# Patient Record
Sex: Female | Born: 1985 | Hispanic: No | Marital: Single | Smoking: Current every day smoker
Health system: Southern US, Community
[De-identification: ages and names within clinical notes are randomized; demographics above are authoritative.]

## PROBLEM LIST (undated history)

## (undated) ENCOUNTER — Emergency Department (HOSPITAL_COMMUNITY): Admission: EM | Payer: Medicaid Other

## (undated) DIAGNOSIS — U071 COVID-19: Secondary | ICD-10-CM

## (undated) DIAGNOSIS — R011 Cardiac murmur, unspecified: Secondary | ICD-10-CM

## (undated) HISTORY — DX: Cardiac murmur, unspecified: R01.1

## (undated) HISTORY — PX: TONSILLECTOMY: SHX5217

---
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
## 2006-01-13 ENCOUNTER — Observation Stay: Payer: Self-pay | Admitting: Obstetrics & Gynecology

## 2006-01-14 ENCOUNTER — Observation Stay: Payer: Self-pay

## 2006-01-17 ENCOUNTER — Inpatient Hospital Stay: Payer: Self-pay | Admitting: Obstetrics & Gynecology

## 2006-01-30 ENCOUNTER — Ambulatory Visit: Payer: Self-pay

## 2006-03-01 ENCOUNTER — Ambulatory Visit: Payer: Self-pay

## 2006-12-03 ENCOUNTER — Emergency Department: Payer: Self-pay | Admitting: Emergency Medicine

## 2007-02-13 ENCOUNTER — Emergency Department: Payer: Self-pay | Admitting: Emergency Medicine

## 2007-02-15 ENCOUNTER — Emergency Department: Payer: Self-pay | Admitting: Emergency Medicine

## 2007-05-25 IMAGING — US US OB US >=[ID] SNGL FETUS
1 series · 17 of 28 positions shown · non-contrast
Comparison: none

REASON FOR EXAM: Pain, mild variables
COMMENTS:

[Series 1: us ob us >=(id) sngl fetus · 17 of 37 slices shown]
[im 1/37]
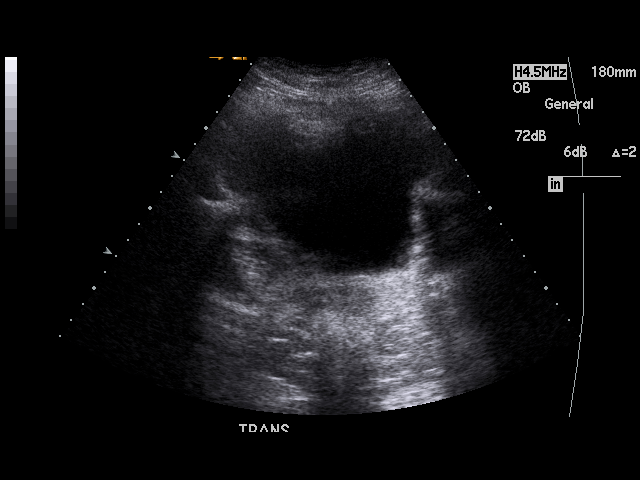
[im 3/37]
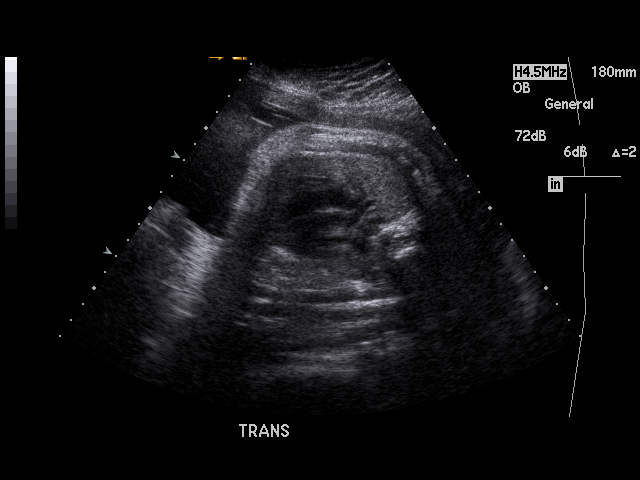
[im 6/37]
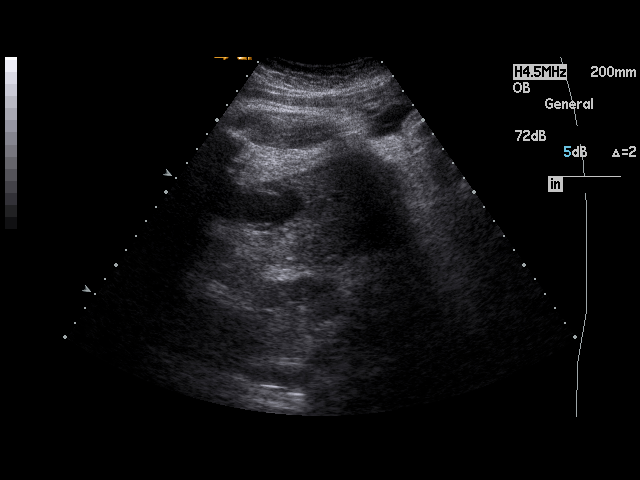
[im 7/37]
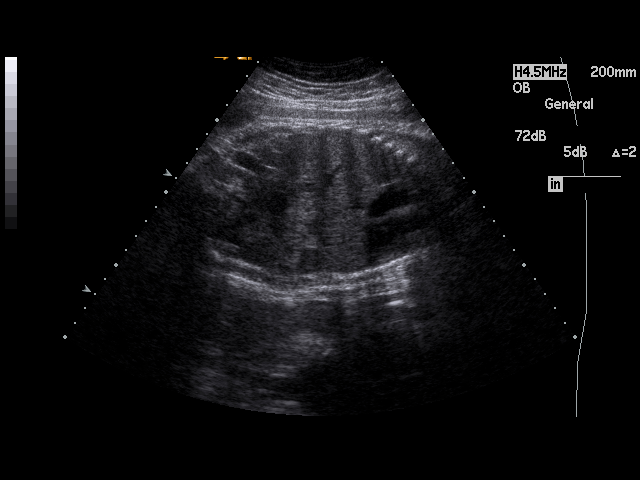
[im 10/37]
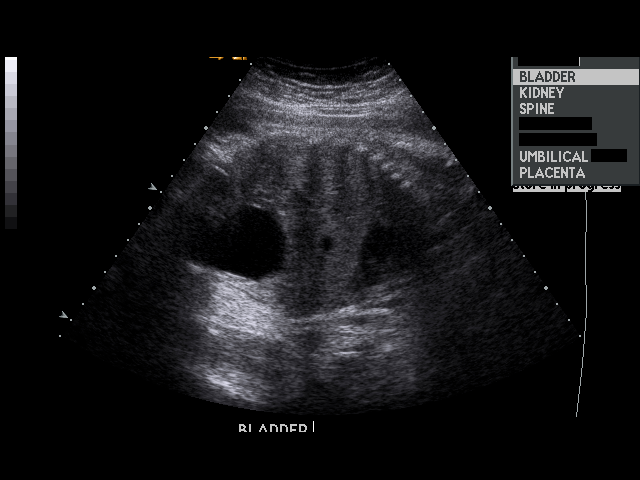
[im 13/37]
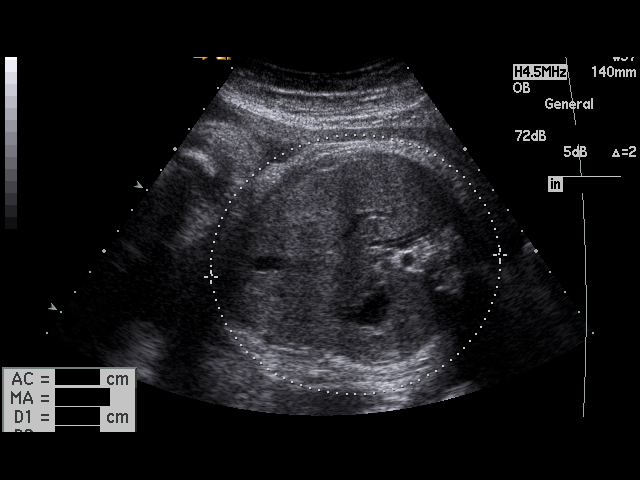
[im 14/37]
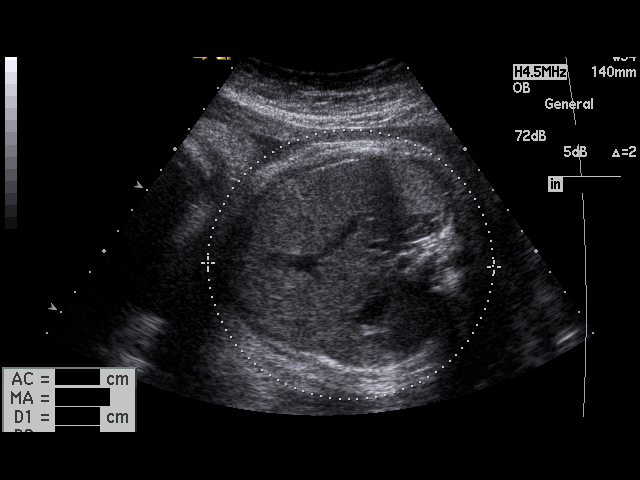
[im 17/37]
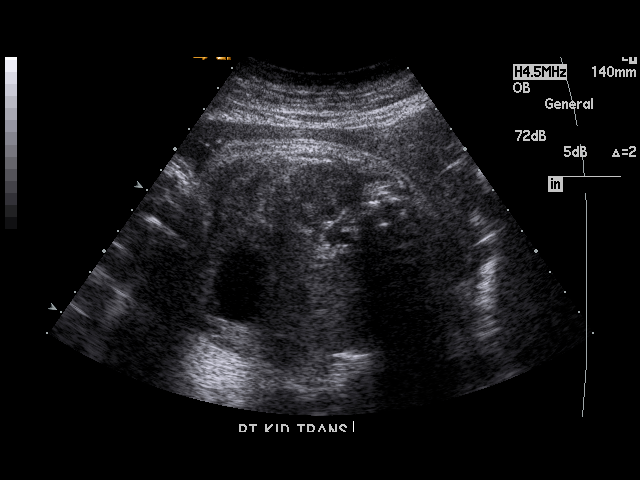
[im 19/37]
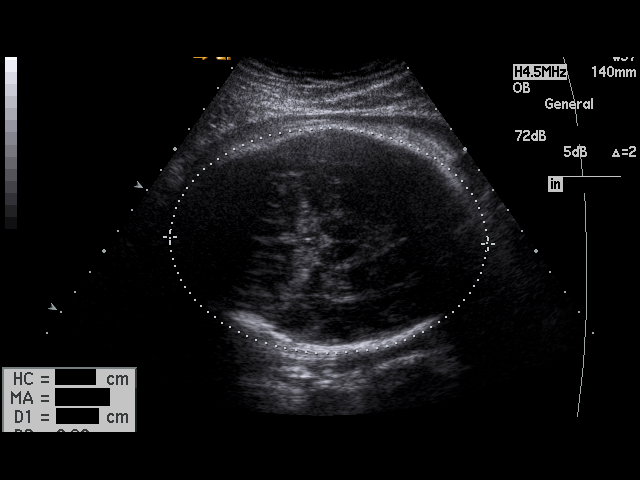
[im 21/37]
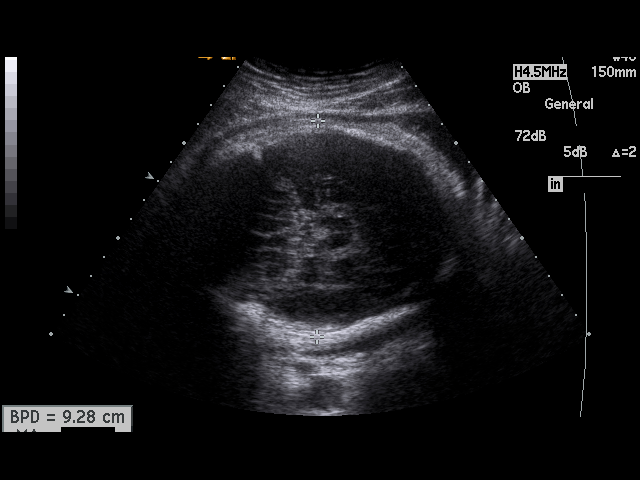
[im 23/37]
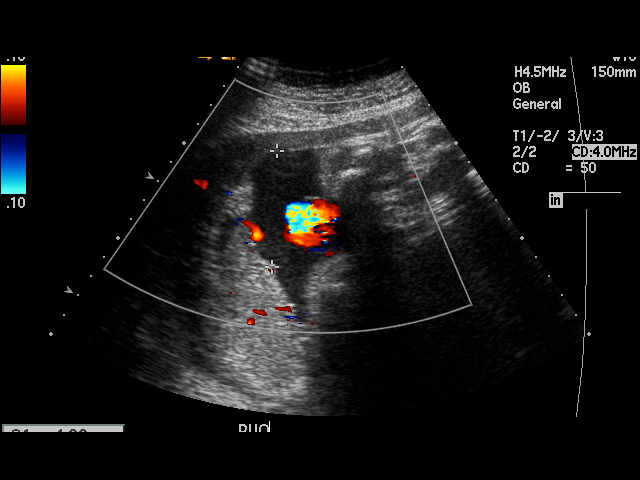
[im 25/37]
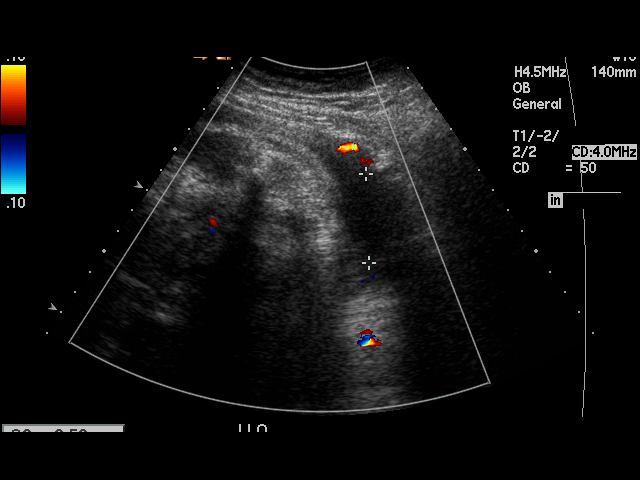
[im 27/37]
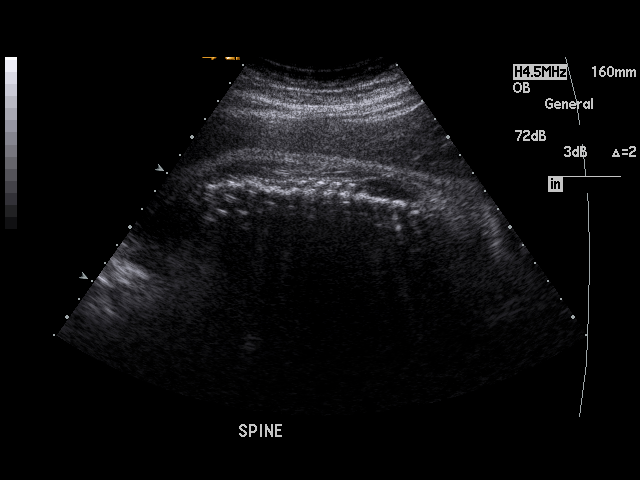
[im 30/37]
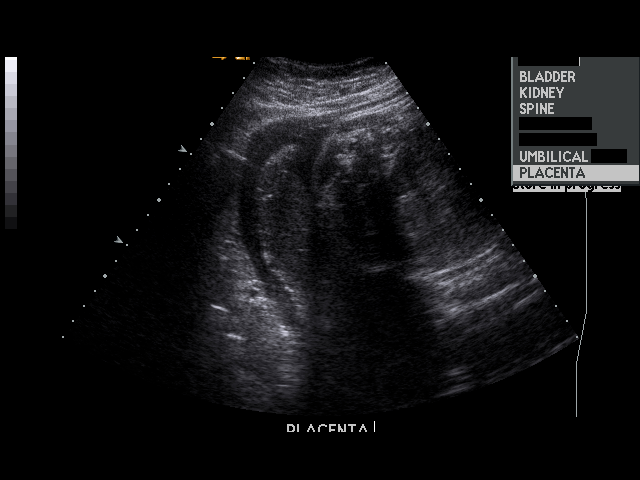
[im 31/37]
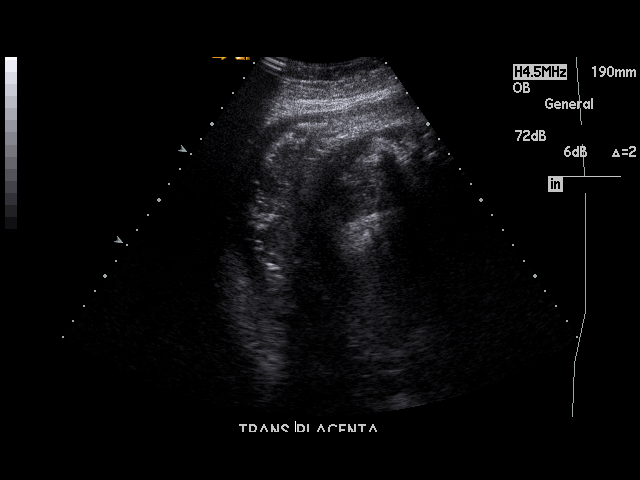
[im 34/37]
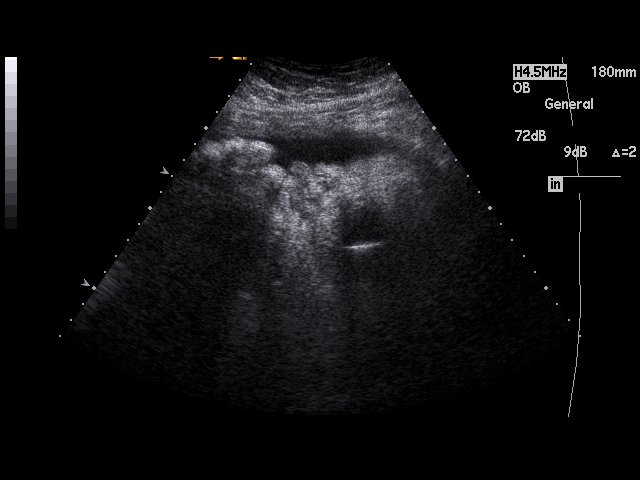
[im 37/37]
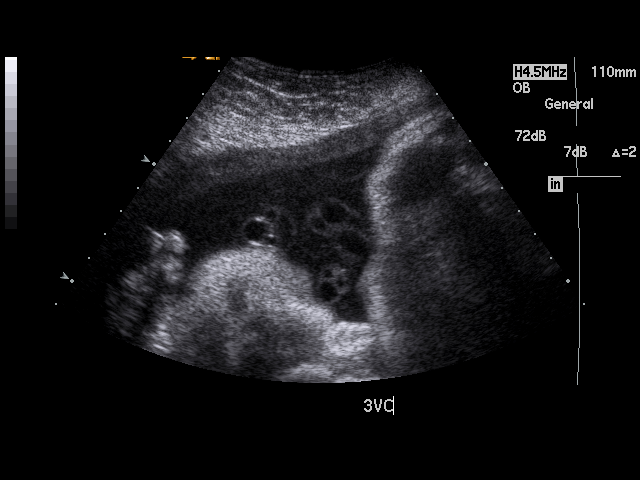

[17 of 28 positions shown; findings below may reference images not displayed]

PROCEDURE:     US  - US OB GREATER/OR EQUAL TO F63RM  - January 13, 2006  [DATE]

RESULT:     The study demonstrates a single intrauterine gestation with
cardiac, trunk and extremity motion during the study with heart rate of 133
beats per minute. Amniotic fluid volume appears to be normal with AFI of
14.13 cm which is between the 70th and 86th percentile. The placenta is
fundal to posterior and Grade III. Fetal anatomy appears to be within normal
limits. The ventricles could not be seen. There is a cephalic presentation.
Sonographically, the fetus measures approximately 38 weeks, 4 days by
ultrasound. Fetal weight is approximately 7 pounds, 15 ounces or 1022 grams.
IMPRESSION: Please see above.

## 2007-05-30 IMAGING — CR DG CHEST 2V
1 series · 2 of 2 positions shown · non-contrast
Comparison: none

REASON FOR EXAM: Post-op fever (103) and rhonchi, right lung
COMMENTS:

[Series 1: view not recorded · 0.17mm/px · 2 of 2 slices shown]
[im 1/2]
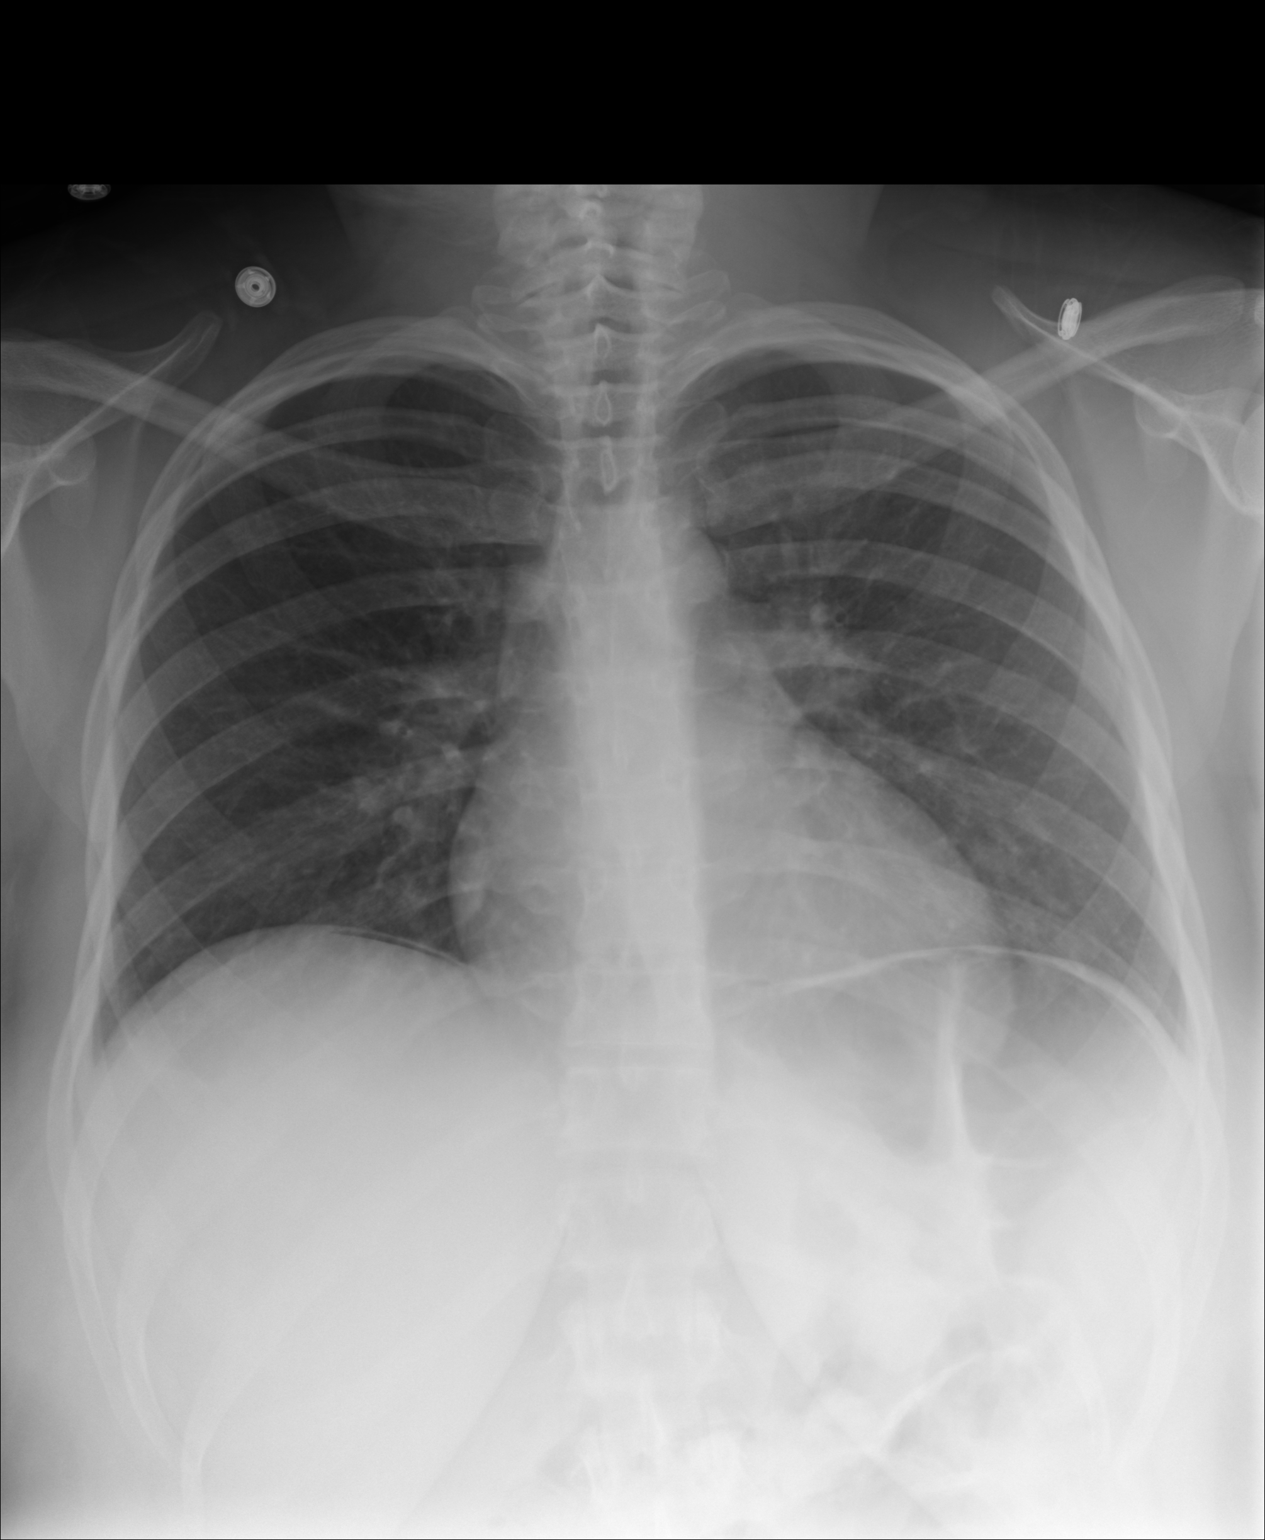
[im 2/2]
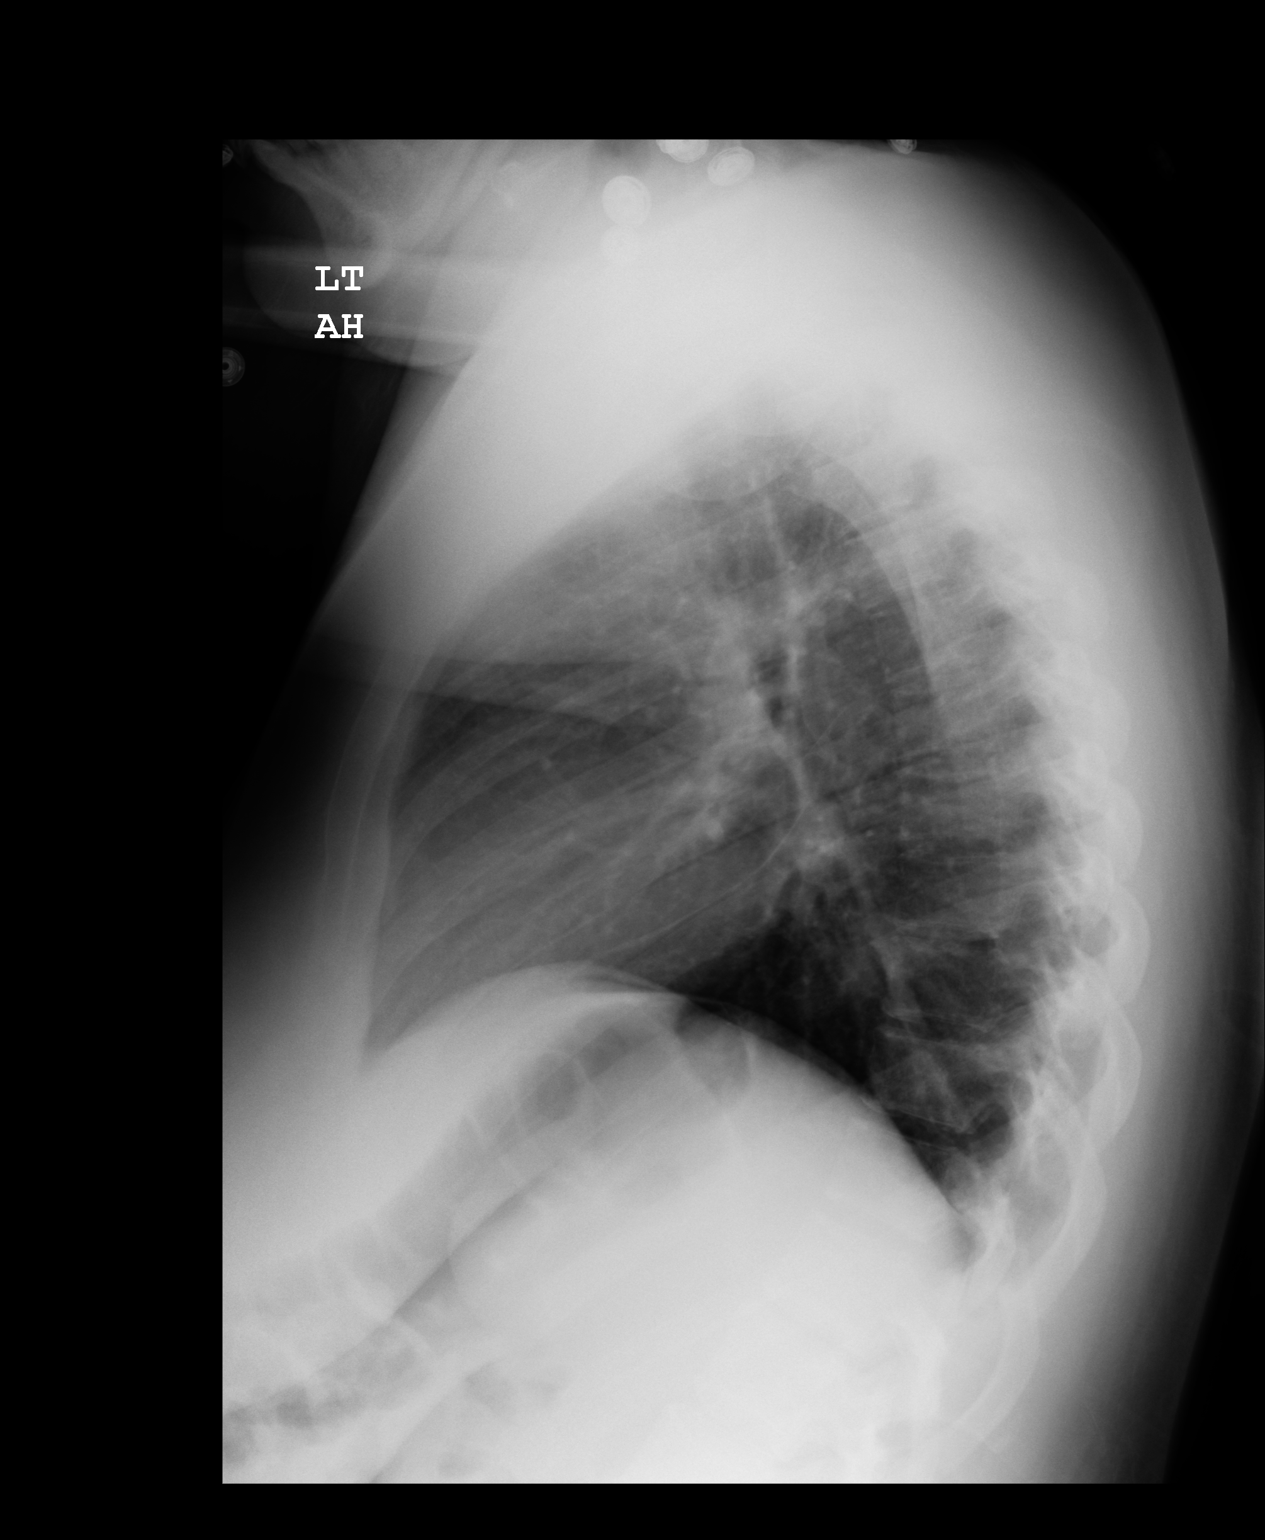

[2 of 2 positions shown; findings below may reference images not displayed]

PROCEDURE:     DXR - DXR CHEST PA (OR AP) AND LATERAL  - January 18, 2006  [DATE]

RESULT:     PA and lateral views reveal the heart to be within normal
limits. There is some linear atelectasis in the RIGHT mid lung field. The
lung fields are otherwise clear. No effusions are seen. A small amount of
free air is noted under the RIGHT hemidiaphragm most likely secondary to
recent surgery.
IMPRESSION: No definite pneumonia is seen. There is some atelectasis in
the RIGHT mid lung field. There is a small amount of free air most likely
secondary recent surgery.

## 2007-06-01 ENCOUNTER — Observation Stay: Payer: Self-pay | Admitting: Obstetrics and Gynecology

## 2007-09-13 ENCOUNTER — Observation Stay: Payer: Self-pay

## 2007-09-21 ENCOUNTER — Observation Stay: Payer: Self-pay

## 2007-09-29 ENCOUNTER — Ambulatory Visit: Payer: Self-pay | Admitting: Obstetrics and Gynecology

## 2007-10-02 ENCOUNTER — Inpatient Hospital Stay: Payer: Self-pay | Admitting: Obstetrics and Gynecology

## 2008-06-24 IMAGING — US US OB < 14 WEEKS - US OB TV
1 series · 17 of 28 positions shown · non-contrast
Comparison: none

REASON FOR EXAM: 7weeks preg. Pelvic Pain, spotting
COMMENTS:

[Series 1: us ob < 14 weeks - us ob tv · 17 of 36 slices shown]
[im 1/36]
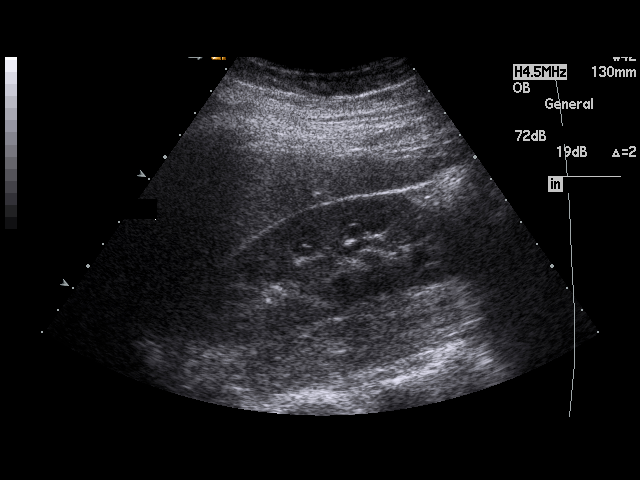
[im 3/36]
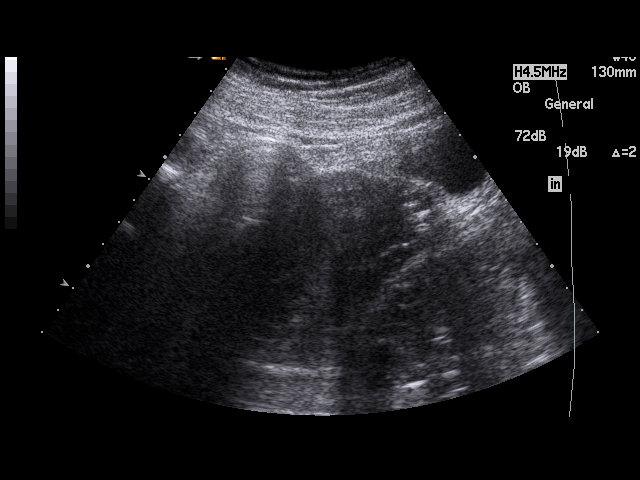
[im 6/36]
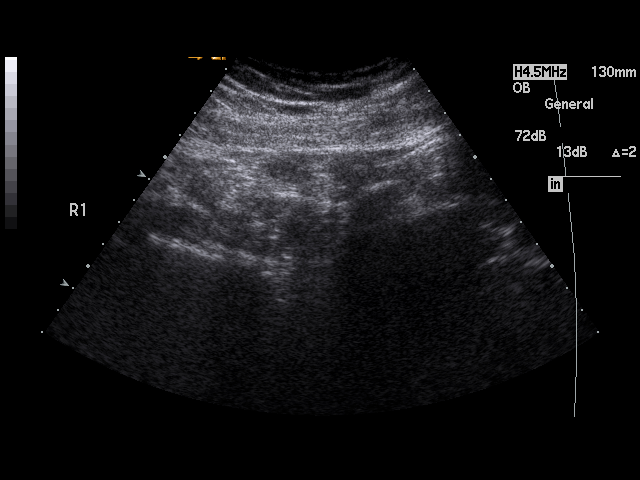
[im 7/36]
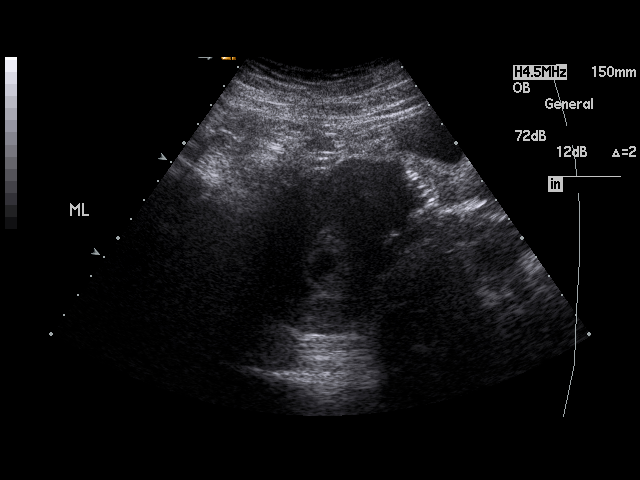
[im 10/36]
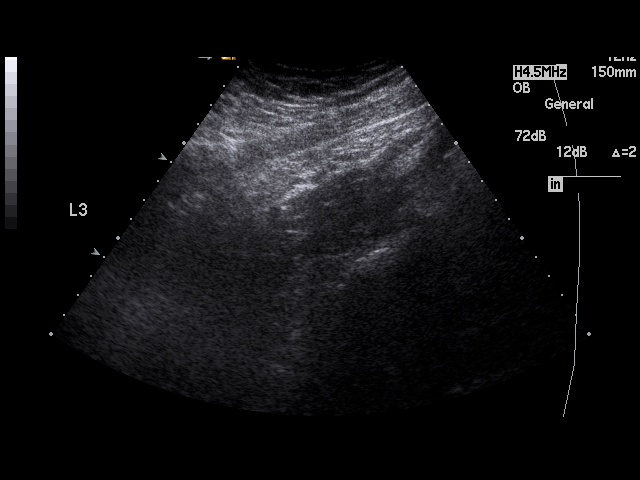
[im 12/36]
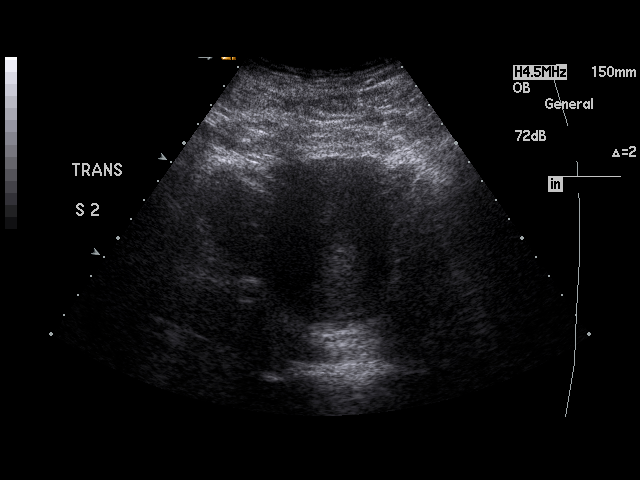
[im 13/36]
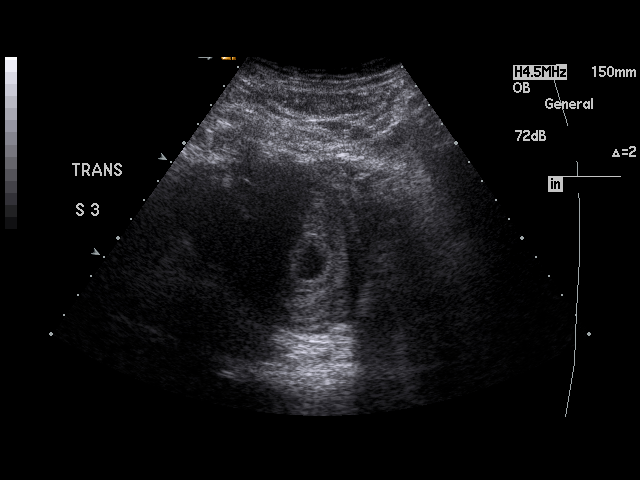
[im 16/36]
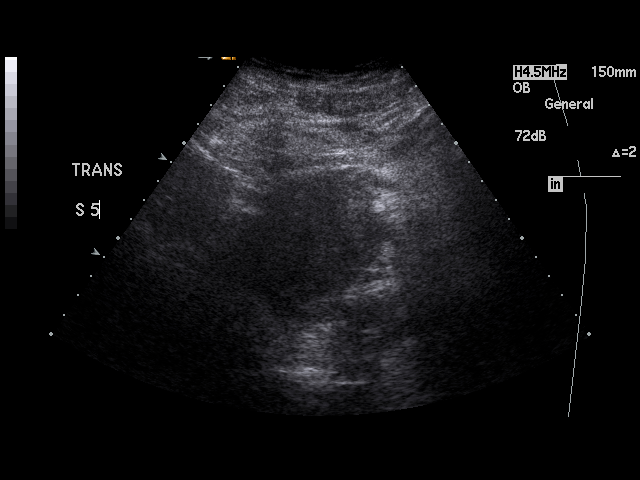
[im 19/36]
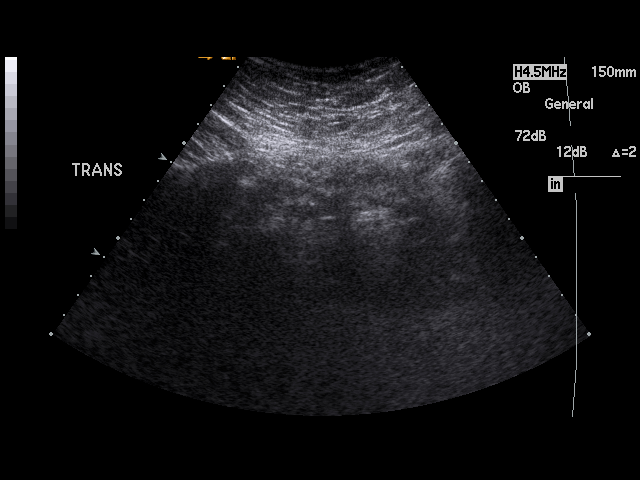
[im 20/36]
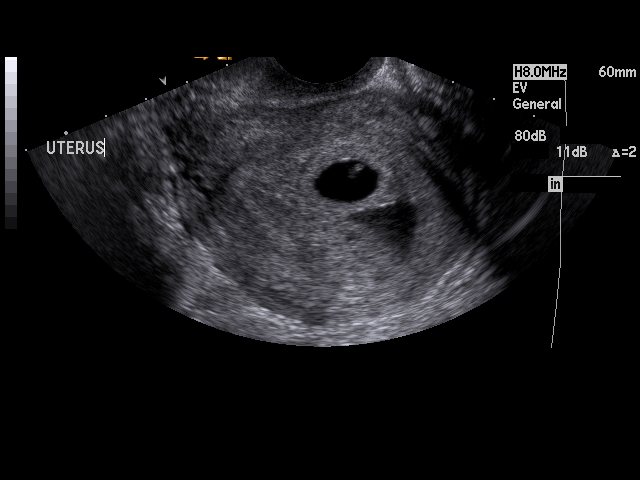
[im 23/36]
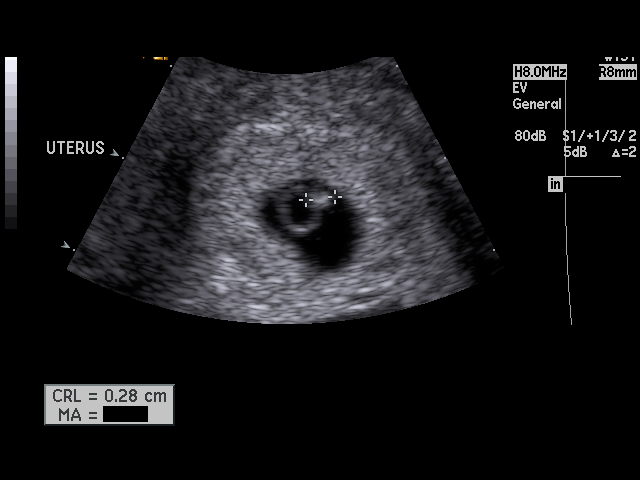
[im 24/36]
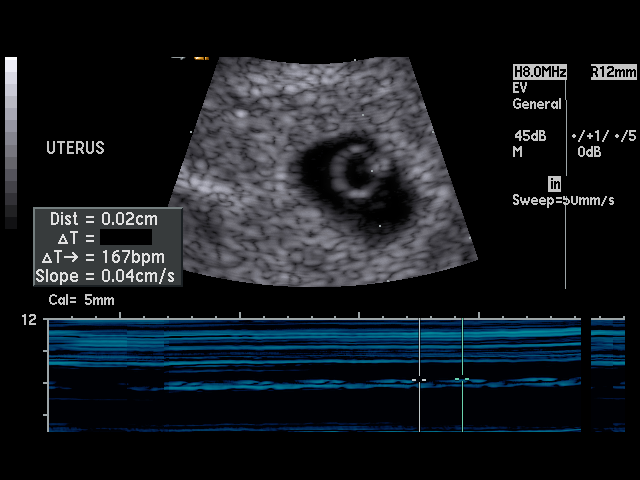
[im 26/36]
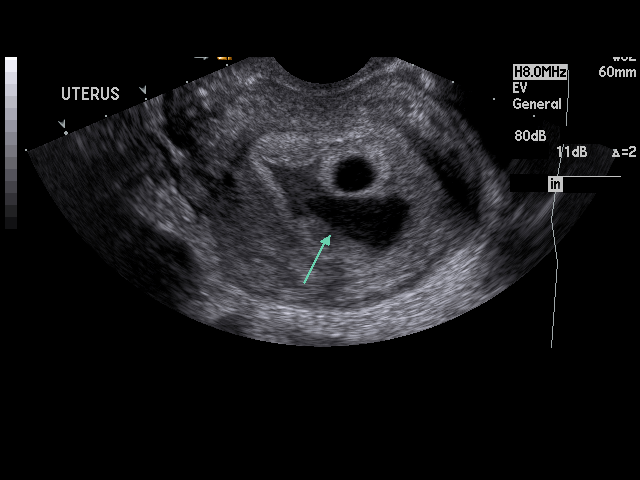
[im 29/36]
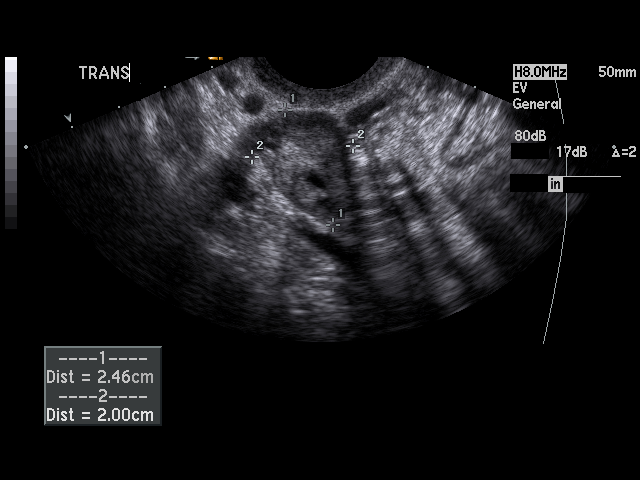
[im 30/36]
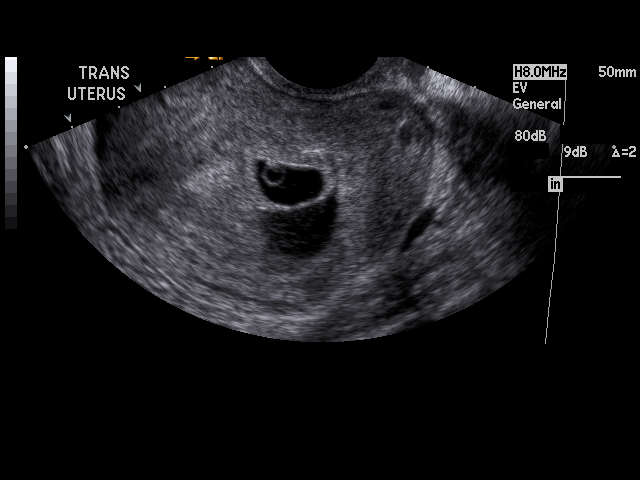
[im 33/36]
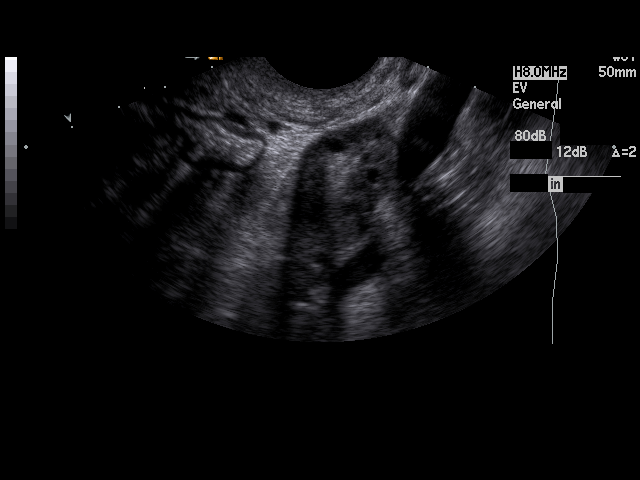
[im 36/36]
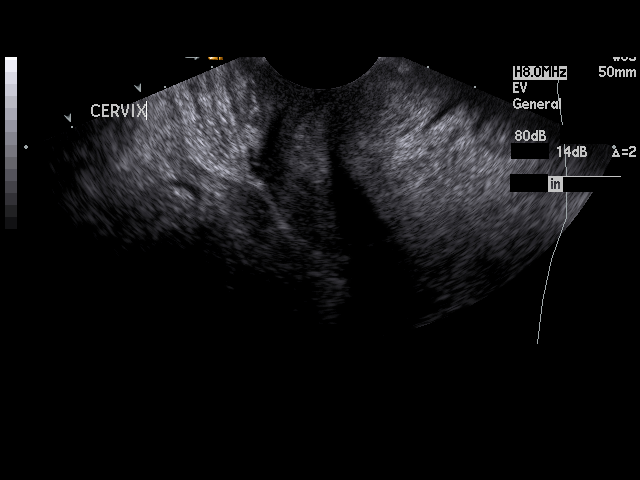

[17 of 28 positions shown; findings below may reference images not displayed]

PROCEDURE:     US  - US OB LESS THAN 14 WEEKS  - February 13, 2007 [DATE]

RESULT:       Transabdominal and endovaginal sonographic imaging was
performed. A single viable intrauterine pregnancy is appreciated with
estimated gestational age of 5 weeks 6 days.  Estimated fetal heart rate of
167 beats per minute.  A fetal pole and yolk sac is identified.  Abutting
the inferior portion of the gestational sac a focal area of hypogenicity
appreciated.  This has a sonographic appearance of a subchorionic
hemorrhage.  No further focal solid or cystic abnormalities are identified
within the uterus or in the region of the gestational sac.  Evaluation of
the RIGHT and LEFT ovaries demonstrate no follicles. There does not appear
to be evidence of RIGHT or LEFT adnexal masses, free fluid or drainable
loculated fluid collections.
IMPRESSION: 1.     Sonographic findings consistent with an early single intrauterine
pregnancy as described above.  Estimated gestational age is 5 weeks 6 days.
There are findings which appear to reflect the sequela of a subchorionic
hemorrhage. This can be monitored if and as clinically warranted.
2.     Dr. Male of the Emergency Department was informed of these
findings via preliminary fax report on 02/13/07.

## 2008-09-03 ENCOUNTER — Emergency Department: Payer: Self-pay | Admitting: Emergency Medicine

## 2009-05-29 ENCOUNTER — Emergency Department: Payer: Self-pay | Admitting: Unknown Physician Specialty

## 2009-07-16 ENCOUNTER — Emergency Department: Payer: Self-pay | Admitting: Emergency Medicine

## 2013-06-01 ENCOUNTER — Emergency Department: Payer: Self-pay | Admitting: Emergency Medicine

## 2013-06-01 LAB — URINALYSIS, COMPLETE
Bilirubin,UR: NEGATIVE
Glucose,UR: NEGATIVE mg/dL (ref 0–75)
Ph: 5 (ref 4.5–8.0)
Protein: NEGATIVE
Specific Gravity: 1.018 (ref 1.003–1.030)
WBC UR: 1 /HPF (ref 0–5)

## 2013-06-01 LAB — CBC
HGB: 13.9 g/dL (ref 12.0–16.0)
MCH: 30.6 pg (ref 26.0–34.0)
MCHC: 34.3 g/dL (ref 32.0–36.0)
Platelet: 211 10*3/uL (ref 150–440)
RDW: 12.8 % (ref 11.5–14.5)
WBC: 11.7 10*3/uL — ABNORMAL HIGH (ref 3.6–11.0)

## 2013-06-04 ENCOUNTER — Emergency Department: Payer: Self-pay | Admitting: Emergency Medicine

## 2013-06-10 ENCOUNTER — Emergency Department: Payer: Self-pay | Admitting: Emergency Medicine

## 2013-06-10 LAB — URINALYSIS, COMPLETE
Glucose,UR: NEGATIVE mg/dL (ref 0–75)
Ketone: NEGATIVE
Nitrite: NEGATIVE
Ph: 5 (ref 4.5–8.0)
Protein: 30
RBC,UR: 2 /HPF (ref 0–5)
WBC UR: 7 /HPF (ref 0–5)

## 2013-06-10 LAB — CBC
HCT: 41 % (ref 35.0–47.0)
HGB: 14.3 g/dL (ref 12.0–16.0)
MCV: 88 fL (ref 80–100)
RBC: 4.64 10*6/uL (ref 3.80–5.20)

## 2013-06-10 LAB — HCG, QUANTITATIVE, PREGNANCY: Beta Hcg, Quant.: 27270 m[IU]/mL — ABNORMAL HIGH

## 2014-01-14 ENCOUNTER — Ambulatory Visit: Payer: Self-pay | Admitting: Obstetrics and Gynecology

## 2014-01-14 LAB — CBC WITH DIFFERENTIAL/PLATELET
Basophil #: 0.1 10*3/uL (ref 0.0–0.1)
Basophil %: 0.6 %
Eosinophil #: 0.3 10*3/uL (ref 0.0–0.7)
Eosinophil %: 2.2 %
HCT: 38.7 % (ref 35.0–47.0)
HGB: 12.6 g/dL (ref 12.0–16.0)
Lymphocyte #: 2.1 10*3/uL (ref 1.0–3.6)
Lymphocyte %: 16.2 %
MCH: 29.5 pg (ref 26.0–34.0)
MCHC: 32.7 g/dL (ref 32.0–36.0)
MCV: 90 fL (ref 80–100)
Monocyte #: 0.8 x10 3/mm (ref 0.2–0.9)
Monocyte %: 6.5 %
Neutrophil #: 9.5 10*3/uL — ABNORMAL HIGH (ref 1.4–6.5)
Neutrophil %: 74.5 %
Platelet: 178 10*3/uL (ref 150–440)
RBC: 4.28 10*6/uL (ref 3.80–5.20)
RDW: 13.1 % (ref 11.5–14.5)
WBC: 12.8 10*3/uL — ABNORMAL HIGH (ref 3.6–11.0)

## 2014-01-15 ENCOUNTER — Inpatient Hospital Stay: Payer: Self-pay

## 2014-01-16 LAB — HEMATOCRIT: HCT: 34.7 % — ABNORMAL LOW (ref 35.0–47.0)

## 2014-10-11 IMAGING — US US OB < 14 WEEKS - US OB TV
1 series · 13 of 28 positions shown · non-contrast
Comparison: none

REASON FOR EXAM: 6 weeks bleeding and cramping
COMMENTS:

PROCEDURE:     US  - US OB LESS THAN 14 WEEKS/W TRANS  - June 01, 2013  [DATE]
RESULT:     OB ultrasound dated 06/01/2013.
TECHNIQUE: Transabdominal and endovaginal imaging of the maternal pelvis was
obtained as well as a less than 14 week gestation. Endovaginal imaging was
performed for characterization. Static imaging was provided for
interpretation.

[Series 1: us ob < 14 weeks - us ob tv · 0.20mm/px · 13 of 97 slices shown]
[im 4/97]
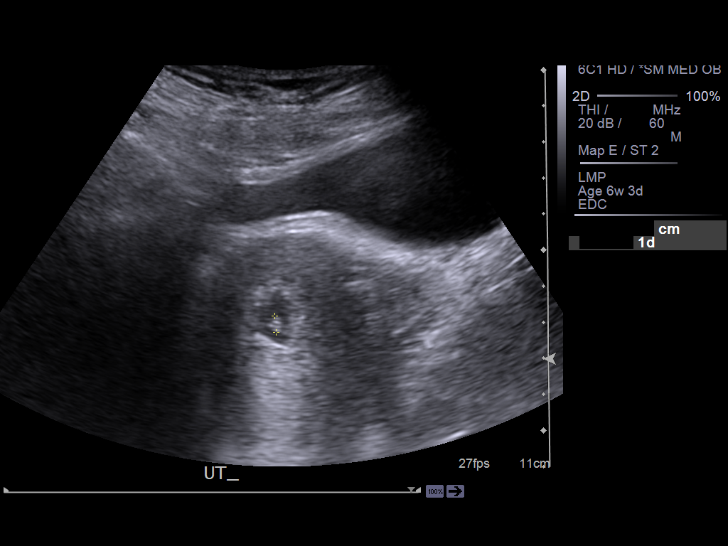
[im 11/97]
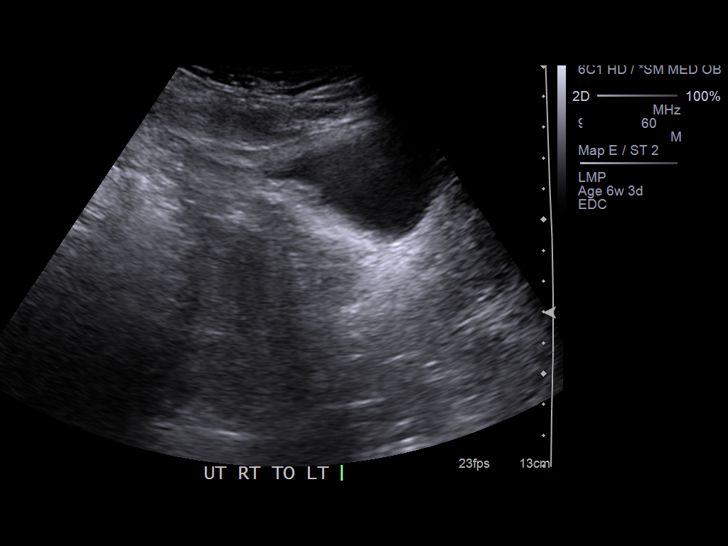
[im 18/97]
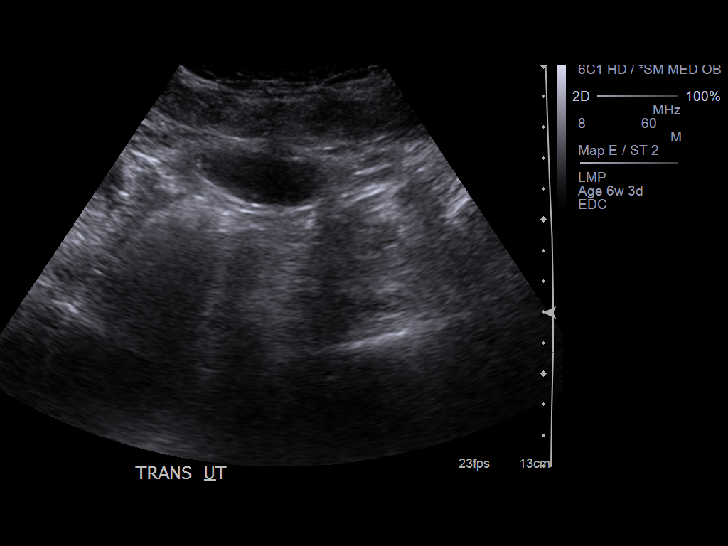
[im 25/97]
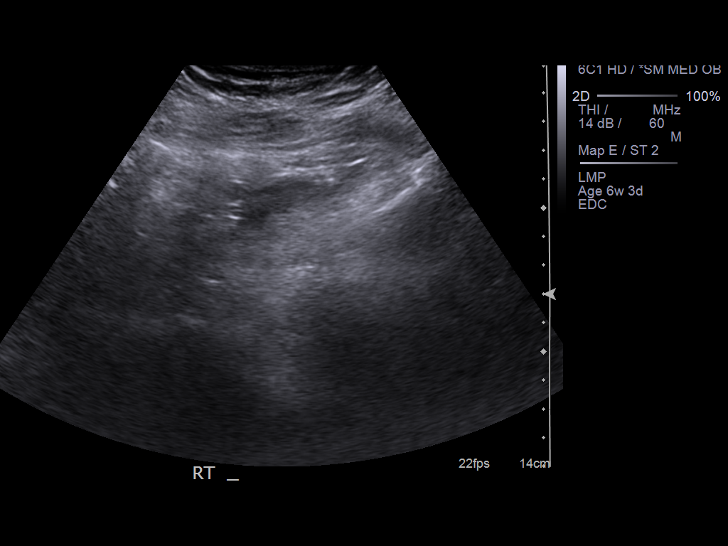
[im 33/97]
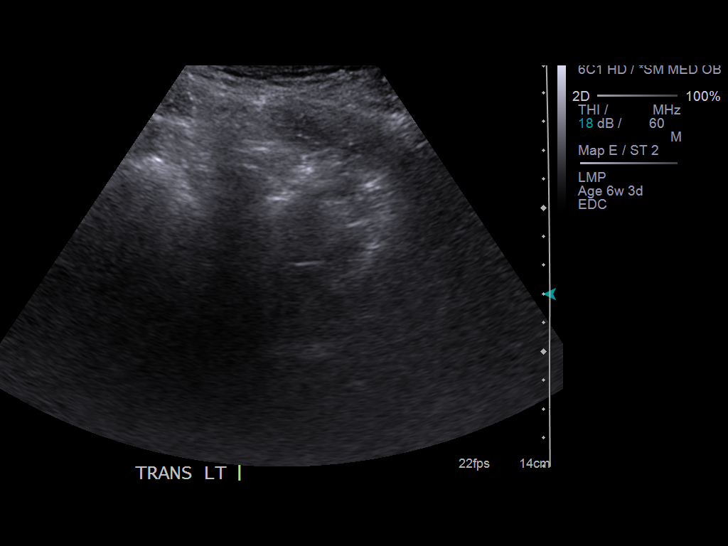
[im 40/97]
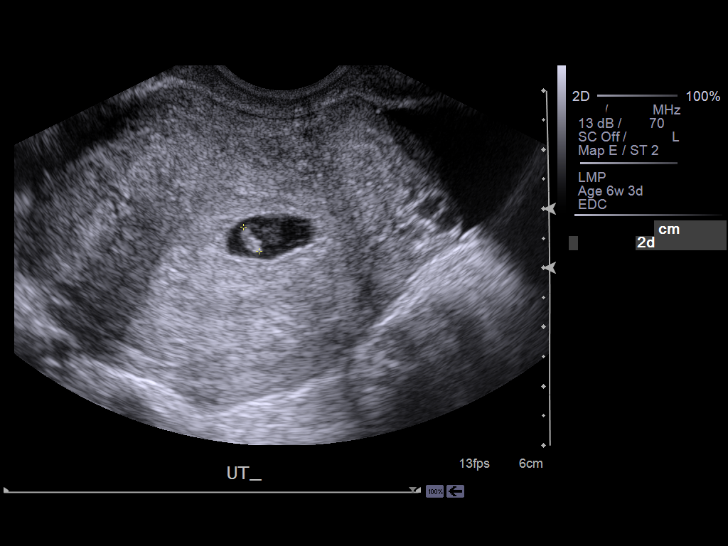
[im 50/97]
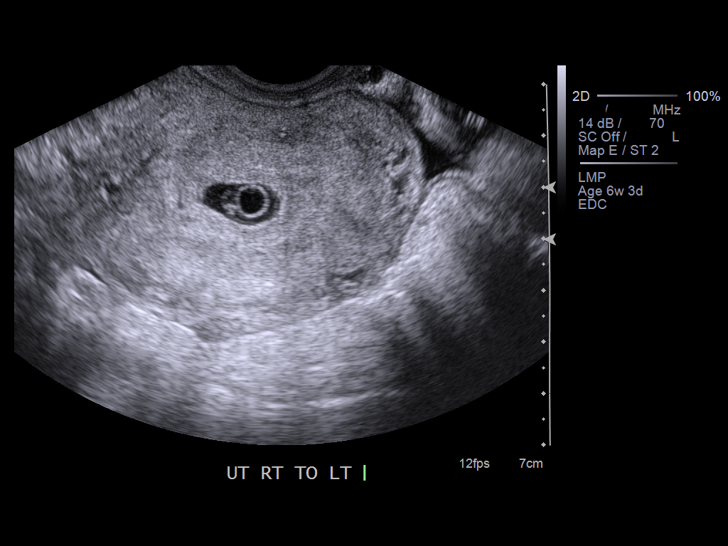
[im 57/97]
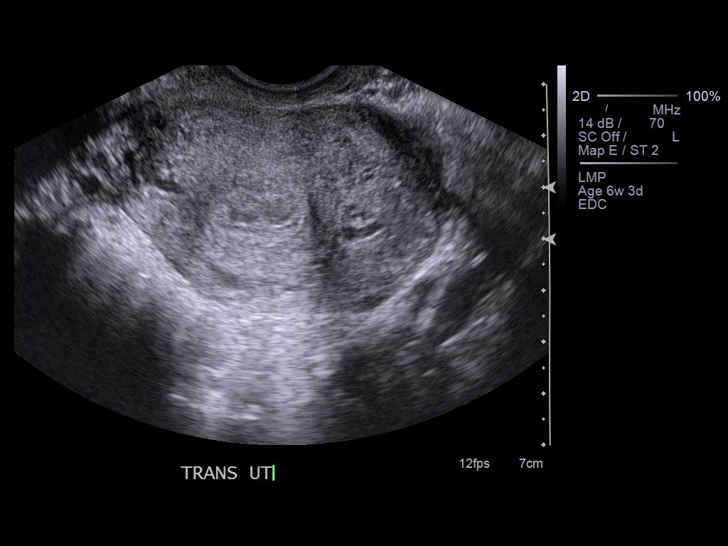
[im 65/97]
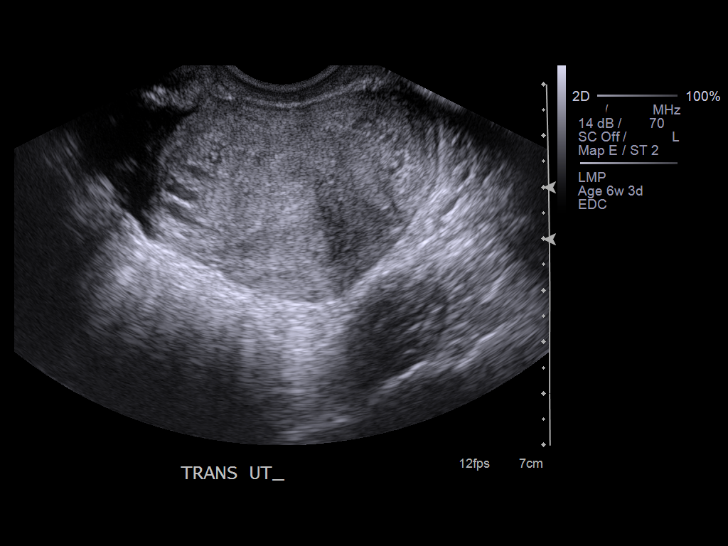
[im 72/97]
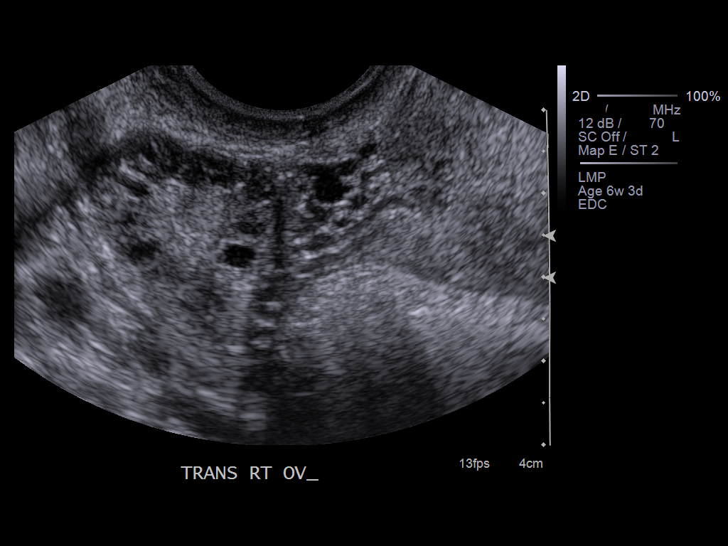
[im 79/97]
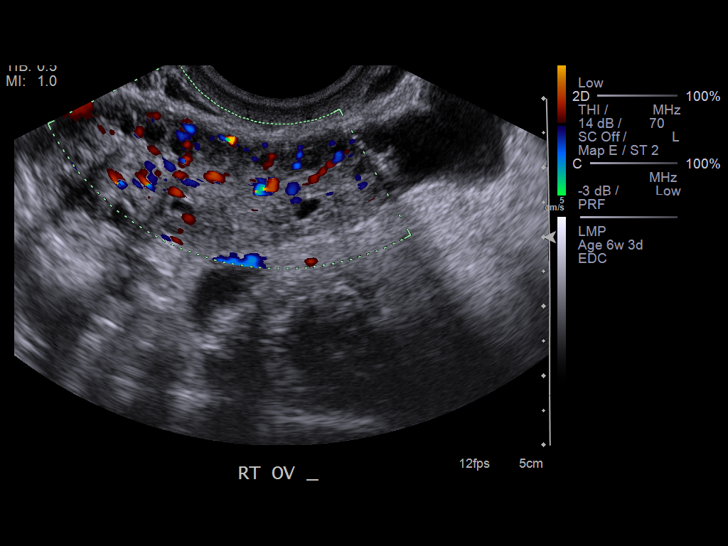
[im 86/97]
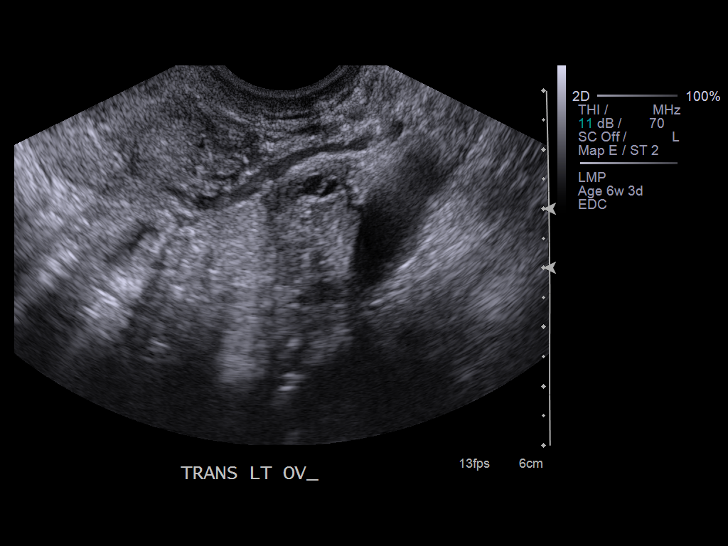
[im 93/97]
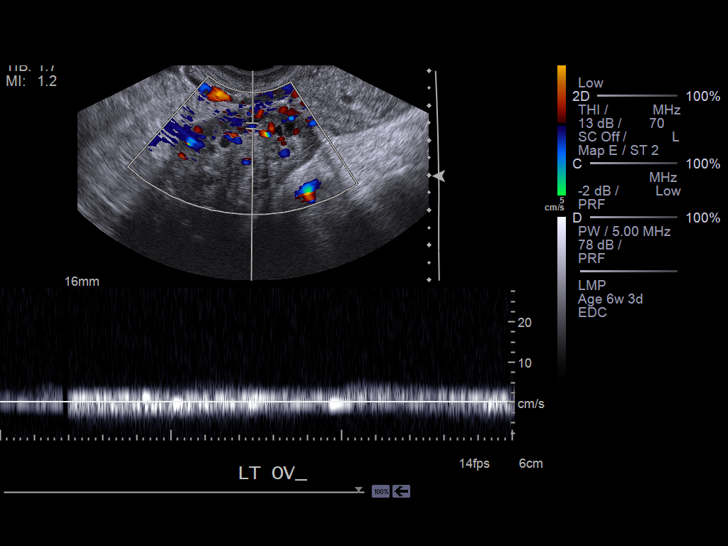

[13 of 28 positions shown; findings below may reference images not displayed]

FINDINGS: An intrauterine gestational sac is identified. A yolk sac is
appreciated measuring 6.7 mm in diameter. Fetal pole is appreciated with a
crown-rump length a 5.1 mm. Corresponds with estimated gestational age of 6
weeks 2 days. Fetal heart rate is appreciated of 102 beats per minute. The
uterus demonstrates homogeneous echotexture. The ovaries are appreciated
demonstrating a proper color filling of vessels as well as arterial and
venous waveforms. Small amount of free fluid within within the cul-de-sac.
The right ovary measures 5.07 x 2.2 x 1.7 cm and the left 4.14 x 1.86 x
cm.
IMPRESSION: Single viable intrauterine pregnancy as described above.
There are findings which are concerning. The yolk sac is enlarged at 6.7 mm
and the fetal heart rate at 102 bpm  at the lower limits of normal. Clinical
and laboratory surveillance is recommended.
2. Small amount of fluid within the cul-de-sac.

## 2015-02-22 NOTE — Op Note (Signed)
PATIENT NAME:  Gabriella Pennington, Gabriella Pennington MR#:  [id number] DATE OF BIRTH:  26-Feb-1986  DATE OF PROCEDURE:  01/15/2014  PREOPERATIVE DIAGNOSES:  1.  Intrauterine pregnancy at 8239 weeks gestational age.  2.  History of prior cesarean section, desires repeat.   POSTOPERATIVE DIAGOSES: 1.  Intrauterine pregnancy at 2539 weeks gestational age.  2.  History of prior cesarean section, desires repeat.   PROCEDURE: Repeat low transverse cesarean section via Pfannenstiel incision.   ANESTHESIA: Spinal.   SURGEON: Thomasene MohairStephen Hira Trent, [name]   ASSISTANT SURGEON: Lebron ConnersAndy Staebler, [name]   ESTIMATED BLOOD LOSS: 750 mL.  OPERATIVE FLUIDS: 1400 mL crystalloid.   COMPLICATIONS: None.   FINDINGS:  1.  Viable female infant with Apgars of 9 and 9 at one and five minutes respectively.  2.  Minimal scar tissue noted in the abdomen was mildly thin lower uterine segment.   SPECIMENS: None.   CONDITION AT THE END OF PROCEDURE: Stable.   PROCEDURE IN DETAIL: The patient was taken to the operating room where regional anesthesia was administered and found to be adequate. She was placed in the dorsal supine position with a leftward tilt and prepped and draped in the usual sterile fashion. After timeout was called, a Pfannenstiel incision was made and carried through the various layers until the peritoneum was identified and entered sharply. The peritoneal opening was extended and after a bladder flap was created, a bladder retractor was placed to hold the bladder out of the operative area of interest. A low transverse hysterotomy was made with a scalpel and the hysterotomy was extended with cranial and caudal tension. The fetal vertex was grasped and elevated to the hysterotomy and with fundal pressure the head followed by the shoulders and the rest of the body were delivered without difficulty. The umbilical cord was clamped and cut and the infant was handed to the pediatrician. Cord blood was collected. The placenta was delivered  spontaneously intact with a 3 vessel cord. The uterus was then exteriorized and cleared of all clots and debris. The hysterotomy was closed using #0 Vicryl in a running locked fashion. A second layer of same suture was used to obtain hemostasis. The uterus was returned to the abdomen. The abdomen was cleared of all clots and debris. The peritoneum was reapproximated using #0 Vicryl in a running fashion.   The On-Q catheter pumps were placed according to the manufacturer's recommendations. They were located approximately 4 cm cephalad to the incision line and inserted to a depth of approximately the fourth marking on the catheters and positioned just superficial to the rectus abdominis muscles and just deep to the rectus fascia.   The fascia was closed using #0 Maxon using 2 sutures, each starting at the lateral apices and meeting in the midline where they were tied together. The subcutaneous tissue was reapproximated using 2-0 plain gut such that no greater than 2 cm of dead space remained. The skin was closed in a subcuticular fashion using 4-0 Monocryl. This incision was reinforced using benzoin as well as Steri-Strips.   The catheters were affixed to the skin using Dermabond, Steri-Strips as well as a Tegaderm. Each catheter was bolused with 5 mL of 0.5% Marcaine plain. A total of 10 mL was bolused.   The patient tolerated the procedure well. Sponge, lap, and needle counts were correct x 2. For VTE prophylaxis, the patient was wearing SCDs throughout the entire procedure. For antibiotic prophylaxis, she was given 2 grams of Ancef prior to skin incision. She was  taken to the recovery area in stable condition.   ____________________________ Conard Novak, MD sdj:aw D: 01/15/2014 09:18:31 ET T: 01/15/2014 12:31:14 ET JOB#: [id number]  cc: Conard Novak, MD, <Dictator> Conard Novak MD ELECTRONICALLY SIGNED 01/26/2014 8:13

## 2015-02-26 ENCOUNTER — Emergency Department: Admit: 2015-02-26 | Discharge status: Against Medical Advice | Payer: Self-pay | Admitting: Emergency Medicine

## 2017-03-14 ENCOUNTER — Ambulatory Visit: Payer: Self-pay | Admitting: Obstetrics and Gynecology

## 2019-02-16 ENCOUNTER — Encounter: Payer: Self-pay | Admitting: Obstetrics & Gynecology

## 2020-09-16 ENCOUNTER — Ambulatory Visit (LOCAL_COMMUNITY_HEALTH_CENTER): Payer: Self-pay

## 2020-09-16 ENCOUNTER — Other Ambulatory Visit: Payer: Self-pay

## 2020-09-16 VITALS — BP 112/69 | Ht 63.0 in | Wt 202.5 lb

## 2020-09-16 DIAGNOSIS — Z3201 Encounter for pregnancy test, result positive: Secondary | ICD-10-CM

## 2020-09-16 LAB — PREGNANCY, URINE: Preg Test, Ur: POSITIVE — AB

## 2020-09-16 MED ORDER — PRENATAL 27-0.8 MG PO TABS
1.0000 | ORAL_TABLET | Freq: Every day | ORAL | 0 refills | Status: AC
Start: 2020-09-16 — End: 2020-12-25
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
## 2020-09-24 ENCOUNTER — Encounter: Payer: Self-pay | Admitting: Advanced Practice Midwife

## 2020-10-02 ENCOUNTER — Encounter: Payer: Self-pay | Admitting: Obstetrics

## 2020-10-03 ENCOUNTER — Other Ambulatory Visit: Payer: Self-pay

## 2020-10-03 ENCOUNTER — Other Ambulatory Visit (HOSPITAL_COMMUNITY)
Admission: RE | Admit: 2020-10-03 | Discharge: 2020-10-03 | Disposition: A | Discharge status: Home / Self Care | Payer: Medicaid Other | Source: Ambulatory Visit | Attending: Advanced Practice Midwife | Admitting: Advanced Practice Midwife

## 2020-10-03 ENCOUNTER — Encounter: Payer: Self-pay | Admitting: Advanced Practice Midwife

## 2020-10-03 ENCOUNTER — Ambulatory Visit (INDEPENDENT_AMBULATORY_CARE_PROVIDER_SITE_OTHER): Payer: Self-pay | Admitting: Advanced Practice Midwife

## 2020-10-03 VITALS — BP 124/74 | Wt 195.0 lb

## 2020-10-03 DIAGNOSIS — N898 Other specified noninflammatory disorders of vagina: Secondary | ICD-10-CM

## 2020-10-03 DIAGNOSIS — Z124 Encounter for screening for malignant neoplasm of cervix: Secondary | ICD-10-CM

## 2020-10-03 DIAGNOSIS — N760 Acute vaginitis: Secondary | ICD-10-CM

## 2020-10-03 DIAGNOSIS — Z113 Encounter for screening for infections with a predominantly sexual mode of transmission: Secondary | ICD-10-CM | POA: Insufficient documentation

## 2020-10-03 DIAGNOSIS — O0991 Supervision of high risk pregnancy, unspecified, first trimester: Secondary | ICD-10-CM

## 2020-10-03 DIAGNOSIS — B9689 Other specified bacterial agents as the cause of diseases classified elsewhere: Secondary | ICD-10-CM

## 2020-10-03 DIAGNOSIS — O23591 Infection of other part of genital tract in pregnancy, first trimester: Secondary | ICD-10-CM

## 2020-10-03 DIAGNOSIS — O99211 Obesity complicating pregnancy, first trimester: Secondary | ICD-10-CM

## 2020-10-03 DIAGNOSIS — A5901 Trichomonal vulvovaginitis: Secondary | ICD-10-CM

## 2020-10-03 DIAGNOSIS — O2341 Unspecified infection of urinary tract in pregnancy, first trimester: Secondary | ICD-10-CM

## 2020-10-03 NOTE — Patient Instructions (Signed)
Exercise During Pregnancy Exercise is an important part of being healthy for people of all ages. Exercise improves the function of your heart and lungs and helps you maintain strength, flexibility, and a healthy body weight. Exercise also boosts energy levels and elevates mood. Most women should exercise regularly during pregnancy. In rare cases, women with certain medical conditions or complications may be asked to limit or avoid exercise during pregnancy. How does this affect me? Along with maintaining general strength and flexibility, exercising during pregnancy can help:  Keep strength in muscles that are used during labor and childbirth.  Decrease low back pain.  Reduce symptoms of depression.  Control weight gain during pregnancy.  Reduce the risk of needing insulin if you develop diabetes during pregnancy.  Decrease the risk of cesarean delivery.  Speed up your recovery after giving birth. How does this affect my baby? Exercise can help you have a healthy pregnancy. Exercise does not cause premature birth. It will not cause your baby to weigh less at birth. What exercises can I do? Many exercises are safe for you to do during pregnancy. Do a variety of exercises that safely increase your heart and breathing rates and help you build and maintain muscle strength. Do exercises exactly as told by your health care provider. You may do these exercises:  Walking or hiking.  Swimming.  Water aerobics.  Riding a stationary bike.  Strength training.  Modified yoga or Pilates. Tell your instructor that you are pregnant. Avoid overstretching, and avoid lying on your back for long periods of time.  Running or jogging. Only choose this type of exercise if you: ? Ran or jogged regularly before your pregnancy. ? Can run or jog and still talk in complete sentences. What exercises should I avoid? Depending on your level of fitness and whether you exercised regularly before your  pregnancy, you may be told to limit high-intensity exercise. You can tell that you are exercising at a high intensity if you are breathing much harder and faster and cannot hold a conversation while exercising. You must avoid:  Contact sports.  Activities that put you at risk for falling on or being hit in the belly, such as downhill skiing, water skiing, surfing, rock climbing, cycling, gymnastics, and horseback riding.  Scuba diving.  Skydiving.  Yoga or Pilates in a room that is heated to high temperatures.  Jogging or running, unless you ran or jogged regularly before your pregnancy. While jogging or running, you should always be able to talk in full sentences. Do not run or jog so fast that you are unable to have a conversation.  Do not exercise at more than 6,000 feet above sea level (high elevation) if you are not used to exercising at high elevation. How do I exercise in a safe way?   Avoid overheating. Do not exercise in very high temperatures.  Wear loose-fitting, breathable clothes.  Avoid dehydration. Drink enough water before, during, and after exercise to keep your urine pale yellow.  Avoid overstretching. Because of hormone changes during pregnancy, it is easy to overstretch muscles, tendons, and ligaments during pregnancy.  Start slowly and ask your health care provider to recommend the types of exercise that are safe for you.  Do not exercise to lose weight. Follow these instructions at home:  Exercise on most days or all days of the week. Try to exercise for 30 minutes a day, 5 days a week, unless your health care provider tells you not to.  If   you actively exercised before your pregnancy and you are healthy, your health care provider may tell you to continue to do moderate to high-intensity exercise.  If you are just starting to exercise or did not exercise much before your pregnancy, your health care provider may tell you to do low to moderate-intensity  exercise. Questions to ask your health care provider  Is exercise safe for me?  What are signs that I should stop exercising?  Does my health condition mean that I should not exercise during pregnancy?  When should I avoid exercising during pregnancy? Stop exercising and contact a health care provider if: You have any unusual symptoms, such as:  Mild contractions of the uterus or cramps in the abdomen.  Dizziness that does not go away when you rest. Stop exercising and get help right away if: You have any unusual symptoms, such as:  Sudden, severe pain in your low back or your belly.  Mild contractions of the uterus or cramps in the abdomen that do not improve with rest and drinking fluids.  Chest pain.  Bleeding or fluid leaking from your vagina.  Shortness of breath. These symptoms may represent a serious problem that is an emergency. Do not wait to see if the symptoms will go away. Get medical help right away. Call your local emergency services (911 in the [place] Do not drive yourself to the hospital. Summary  Most women should exercise regularly throughout pregnancy. In rare cases, women with certain medical conditions or complications may be asked to limit or avoid exercise during pregnancy.  Do not exercise to lose weight during pregnancy.  Your health care provider will tell you what level of physical activity is right for you.  Stop exercising and contact a health care provider if you have mild contractions of the uterus or cramps in the abdomen. Get help right away if these contractions or cramps do not improve with rest and drinking fluids.  Stop exercising and get help right away if you have sudden, severe pain in your low back or belly, chest pain, shortness of breath, or bleeding or leaking of fluid from your vagina. This information is not intended to replace advice given to you by your health care provider. Make sure you discuss any questions you have with your  health care provider. Document Revised: 02/08/2019 Document Reviewed: 11/22/2018 Elsevier Patient Education  2020 [employer] Eating Plan for Pregnant Women While you are pregnant, your body requires additional nutrition to help support your growing baby. You also have a higher need for some vitamins and minerals, such as folic acid, calcium, iron, and vitamin D. Eating a healthy, well-balanced diet is very important for your health and your baby's health. Your need for extra calories varies for the three 3-month segments of your pregnancy (trimesters). For most women, it is recommended to consume:  150 extra calories a day during the first trimester.  300 extra calories a day during the second trimester.  300 extra calories a day during the third trimester. What are tips for following this plan?   Do not try to lose weight or go on a diet during pregnancy.  Limit your overall intake of foods that have "empty calories." These are foods that have little nutritional value, such as sweets, desserts, candies, and sugar-sweetened beverages.  Eat a variety of foods (especially fruits and vegetables) to get a full range of vitamins and minerals.  Take a prenatal vitamin to help meet your additional vitamin and mineral needs   during pregnancy, specifically for folic acid, iron, calcium, and vitamin D.  Remember to stay active. Ask your health care provider what types of exercise and activities are safe for you.  Practice good food safety and cleanliness. Wash your hands before you eat and after you prepare raw meat. Wash all fruits and vegetables well before peeling or eating. Taking these actions can help to prevent food-borne illnesses that can be very dangerous to your baby, such as listeriosis. Ask your health care provider for more information about listeriosis. What does 150 extra calories look like? Healthy options that provide 150 extra calories each day could be any of the  following:  6-8 oz (170-230 g) of plain low-fat yogurt with  cup of berries.  1 apple with 2 teaspoons (11 g) of peanut butter.  Cut-up vegetables with  cup (60 g) of hummus.  8 oz (230 mL) or 1 cup of low-fat chocolate milk.  1 stick of string cheese with 1 medium orange.  1 peanut butter and jelly sandwich that is made with one slice of whole-wheat bread and 1 tsp (5 g) of peanut butter. For 300 extra calories, you could eat two of those healthy options each day. What is a healthy amount of weight to gain? The right amount of weight gain for you is based on your BMI before you became pregnant. If your BMI:  Was less than 18 (underweight), you should gain 28-40 lb (13-18 kg).  Was 18-24.9 (normal), you should gain 25-35 lb (11-16 kg).  Was 25-29.9 (overweight), you should gain 15-25 lb (7-11 kg).  Was 30 or greater (obese), you should gain 11-20 lb (5-9 kg). What if I am having twins or multiples? Generally, if you are carrying twins or multiples:  You may need to eat 300-600 extra calories a day.  The recommended range for total weight gain is 25-54 lb (11-25 kg), depending on your BMI before pregnancy.  Talk with your health care provider to find out about nutritional needs, weight gain, and exercise that is right for you. What foods can I eat?  Fruits All fruits. Eat a variety of colors and types of fruit. Remember to wash your fruits well before peeling or eating. Vegetables All vegetables. Eat a variety of colors and types of vegetables. Remember to wash your vegetables well before peeling or eating. Grains All grains. Choose whole grains, such as whole-wheat bread, oatmeal, or brown rice. Meats and other protein foods Lean meats, including chicken, turkey, fish, and lean cuts of beef, veal, or pork. If you eat fish or seafood, choose options that are higher in omega-3 fatty acids and lower in mercury, such as salmon, herring, mussels, trout, sardines, pollock,  shrimp, crab, and lobster. Tofu. Tempeh. Beans. Eggs. Peanut butter and other nut butters. Make sure that all meats, poultry, and eggs are cooked to food-safe temperatures or "well-done." Two or more servings of fish are recommended each week in order to get the most benefits from omega-3 fatty acids that are found in seafood. Choose fish that are lower in mercury. You can find more information online:  www.fda.gov Dairy Pasteurized milk and milk alternatives (such as almond milk). Pasteurized yogurt and pasteurized cheese. Cottage cheese. Sour cream. Beverages Water. Juices that contain 100% fruit juice or vegetable juice. Caffeine-free teas and decaffeinated coffee. Drinks that contain caffeine are okay to drink, but it is better to avoid caffeine. Keep your total caffeine intake to less than 200 mg each day (which is 12 oz   or 355 mL of coffee, tea, or soda) or the limit as told by your health care provider. Fats and oils Fats and oils are okay to include in moderation. Sweets and desserts Sweets and desserts are okay to include in moderation. Seasoning and other foods All pasteurized condiments. The items listed above may not be a complete list of foods and beverages you can eat. Contact a dietitian for more information. What foods are not recommended? Fruits Unpasteurized fruit juices. Vegetables Raw (unpasteurized) vegetable juices. Meats and other protein foods Lunch meats, bologna, hot dogs, or other deli meats. (If you must eat those meats, reheat them until they are steaming hot.) Refrigerated pat, meat spreads from a meat counter, smoked seafood that is found in the refrigerated section of a store. Raw or undercooked meats, poultry, and eggs. Raw fish, such as sushi or sashimi. Fish that have high mercury content, such as tilefish, shark, swordfish, and king mackerel. To learn more about mercury in fish, talk with your health care provider or look for online resources, such  as:  www.fda.gov Dairy Raw (unpasteurized) milk and any foods that have raw milk in them. Soft cheeses, such as feta, queso blanco, queso fresco, Brie, Camembert cheeses, blue-veined cheeses, and Panela cheese (unless it is made with pasteurized milk, which must be stated on the label). Beverages Alcohol. Sugar-sweetened beverages, such as sodas, teas, or energy drinks. Seasoning and other foods Homemade fermented foods and drinks, such as pickles, sauerkraut, or kombucha drinks. (Store-bought pasteurized versions of these are okay.) Salads that are made in a store or deli, such as ham salad, chicken salad, egg salad, tuna salad, and seafood salad. The items listed above may not be a complete list of foods and beverages you should avoid. Contact a dietitian for more information. Where to find more information To calculate the number of calories you need based on your height, weight, and activity level, you can use an online calculator such as:  www.choosemyplate.gov/MyPlatePlan To calculate how much weight you should gain during pregnancy, you can use an online pregnancy weight gain calculator such as:  www.choosemyplate.gov/pregnancy-weight-gain-calculator Summary  While you are pregnant, your body requires additional nutrition to help support your growing baby.  Eat a variety of foods, especially fruits and vegetables to get a full range of vitamins and minerals.  Practice good food safety and cleanliness. Wash your hands before you eat and after you prepare raw meat. Wash all fruits and vegetables well before peeling or eating. Taking these actions can help to prevent food-borne illnesses, such as listeriosis, that can be very dangerous to your baby.  Do not eat raw meat or fish. Do not eat fish that have high mercury content, such as tilefish, shark, swordfish, and king mackerel. Do not eat unpasteurized (raw) dairy.  Take a prenatal vitamin to help meet your additional vitamin and  mineral needs during pregnancy, specifically for folic acid, iron, calcium, and vitamin D. This information is not intended to replace advice given to you by your health care provider. Make sure you discuss any questions you have with your health care provider. Document Revised: 03/08/2019 Document Reviewed: 07/15/2017 Elsevier Patient Education  2020 [employer] Prenatal Care Prenatal care is health care during pregnancy. It helps you and your unborn baby (fetus) stay as healthy as possible. Prenatal care may be provided by a midwife, a family practice health care provider, or a childbirth and pregnancy specialist (obstetrician). How does this affect me? During pregnancy, you will be closely monitored   for any new conditions that might develop. To lower your risk of pregnancy complications, you and your health care provider will talk about any underlying conditions you have. How does this affect my baby? Early and consistent prenatal care increases the chance that your baby will be healthy during pregnancy. Prenatal care lowers the risk that your baby will be:  Born early (prematurely).  Smaller than expected at birth (small for gestational age). What can I expect at the first prenatal care visit? Your first prenatal care visit will likely be the longest. You should schedule your first prenatal care visit as soon as you know that you are pregnant. Your first visit is a good time to talk about any questions or concerns you have about pregnancy. At your visit, you and your health care provider will talk about:  Your medical history, including: ? Any past pregnancies. ? Your family's medical history. ? The baby's father's medical history. ? Any long-term (chronic) health conditions you have and how you manage them. ? Any surgeries or procedures you have had. ? Any current over-the-counter or prescription medicines, herbs, or supplements you are taking.  Other factors that could pose a risk  to your baby, including:  Your home setting and your stress levels, including: ? Exposure to abuse or violence. ? Household financial strain. ? Mental health conditions you have.  Your daily health habits, including diet and exercise. Your health care provider will also:  Measure your weight, height, and blood pressure.  Do a physical exam, including a pelvic and breast exam.  Perform blood tests and urine tests to check for: ? Urinary tract infection. ? Sexually transmitted infections (STIs). ? Low iron levels in your blood (anemia). ? Blood type and certain proteins on red blood cells (Rh antibodies). ? Infections and immunity to viruses, such as hepatitis B and rubella. ? HIV (human immunodeficiency virus).  Do an ultrasound to confirm your baby's growth and development and to help predict your estimated due date (EDD). This ultrasound is done with a probe that is inserted into the vagina (transvaginal ultrasound).  Discuss your options for genetic screening.  Give you information about how to keep yourself and your baby healthy, including: ? Nutrition and taking vitamins. ? Physical activity. ? How to manage pregnancy symptoms such as nausea and vomiting (morning sickness). ? Infections and substances that may be harmful to your baby and how to avoid them. ? Food safety. ? Dental care. ? Working. ? Travel. ? Warning signs to watch for and when to call your health care provider. How often will I have prenatal care visits? After your first prenatal care visit, you will have regular visits throughout your pregnancy. The visit schedule is often as follows:  Up to week 28 of pregnancy: once every 4 weeks.  28-36 weeks: once every 2 weeks.  After 36 weeks: every week until delivery. Some women may have visits more or less often depending on any underlying health conditions and the health of the baby. Keep all follow-up and prenatal care visits as told by your health care  provider. This is important. What happens during routine prenatal care visits? Your health care provider will:  Measure your weight and blood pressure.  Check for fetal heart sounds.  Measure the height of your uterus in your abdomen (fundal height). This may be measured starting around week 20 of pregnancy.  Check the position of your baby inside your uterus.  Ask questions about your diet, sleeping patterns, and   whether you can feel the baby move.  Review warning signs to watch for and signs of labor.  Ask about any pregnancy symptoms you are having and how you are dealing with them. Symptoms may include: ? Headaches. ? Nausea and vomiting. ? Vaginal discharge. ? Swelling. ? Fatigue. ? Constipation. ? Any discomfort, including back or pelvic pain. Make a list of questions to ask your health care provider at your routine visits. What tests might I have during prenatal care visits? You may have blood, urine, and imaging tests throughout your pregnancy, such as:  Urine tests to check for glucose, protein, or signs of infection.  Glucose tests to check for a form of diabetes that can develop during pregnancy (gestational diabetes mellitus). This is usually done around week 24 of pregnancy.  An ultrasound to check your baby's growth and development and to check for birth defects. This is usually done around week 20 of pregnancy.  A test to check for group B strep (GBS) infection. This is usually done around week 36 of pregnancy.  Genetic testing. This may include blood or imaging tests, such as an ultrasound. Some genetic tests are done during the first trimester and some are done during the second trimester. What else can I expect during prenatal care visits? Your health care provider may recommend getting certain vaccines during pregnancy. These may include:  A yearly flu shot (annual influenza vaccine). This is especially important if you will be pregnant during flu  season.  Tdap (tetanus, diphtheria, pertussis) vaccine. Getting this vaccine during pregnancy can protect your baby from whooping cough (pertussis) after birth. This vaccine may be recommended between weeks 27 and 36 of pregnancy. Later in your pregnancy, your health care provider may give you information about:  Childbirth and breastfeeding classes.  Choosing a health care provider for your baby.  Umbilical cord banking.  Breastfeeding.  Birth control after your baby is born.  The hospital labor and delivery unit and how to tour it.  Registering at the hospital before you go into labor. Where to find more information  Office on Women's Health: womenshealth.gov  American Pregnancy Association: americanpregnancy.org  March of Dimes: marchofdimes.org Summary  Prenatal care helps you and your baby stay as healthy as possible during pregnancy.  Your first prenatal care visit will most likely be the longest.  You will have visits and tests throughout your pregnancy to monitor your health and your baby's health.  Bring a list of questions to your visits to ask your health care provider.  Make sure to keep all follow-up and prenatal care visits with your health care provider. This information is not intended to replace advice given to you by your health care provider. Make sure you discuss any questions you have with your health care provider. Document Revised: 02/07/2019 Document Reviewed: 10/17/2017 Elsevier Patient Education  2020 [employer]  

## 2020-10-03 NOTE — Progress Notes (Signed)
NOB today. LMP 07/17/2020

## 2020-10-06 LAB — CYTOLOGY - PAP
Chlamydia: NEGATIVE
Comment: NEGATIVE
Comment: NEGATIVE
Comment: NEGATIVE
Comment: NORMAL
Diagnosis: NEGATIVE
High risk HPV: NEGATIVE
Neisseria Gonorrhea: NEGATIVE
Trichomonas: POSITIVE — AB

## 2020-10-06 LAB — CERVICOVAGINAL ANCILLARY ONLY
Bacterial Vaginitis (gardnerella): POSITIVE — AB
Candida Glabrata: NEGATIVE
Candida Vaginitis: NEGATIVE
Comment: NEGATIVE
Comment: NEGATIVE
Comment: NEGATIVE

## 2020-10-07 DIAGNOSIS — O99211 Obesity complicating pregnancy, first trimester: Secondary | ICD-10-CM | POA: Insufficient documentation

## 2020-10-07 DIAGNOSIS — O0991 Supervision of high risk pregnancy, unspecified, first trimester: Secondary | ICD-10-CM | POA: Insufficient documentation

## 2020-10-07 HISTORY — DX: Obesity complicating pregnancy, first trimester: O99.211

## 2020-10-07 LAB — URINE CULTURE

## 2020-10-07 MED ORDER — METRONIDAZOLE 500 MG PO TABS: 2000.0000 mg | ORAL_TABLET | Freq: Once | ORAL | 0 refills | Status: AC

## 2020-10-07 MED ORDER — CEPHALEXIN 500 MG PO CAPS: 500.0000 mg | ORAL_CAPSULE | Freq: Four times a day (QID) | ORAL | 2 refills | Status: DC
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
## 2020-10-10 ENCOUNTER — Encounter: Payer: Self-pay | Admitting: Obstetrics

## 2020-10-10 ENCOUNTER — Other Ambulatory Visit: Payer: Self-pay

## 2020-10-15 ENCOUNTER — Telehealth: Payer: Self-pay

## 2020-10-15 NOTE — Telephone Encounter (Signed)
Patient inquiring what kind of medication the detention center was giving her. Cb#[phone]

## 2020-10-15 NOTE — Telephone Encounter (Signed)
Spoke w/patient. She states she was given KEFLEX at the detention center and they told her she had Gonorrhea. Advised patient she had BV,Trich, UTI. She completed her keflex. She has an appt tomorrow and will discuss any further questions at that appointment.

## 2020-10-16 ENCOUNTER — Other Ambulatory Visit: Payer: Self-pay | Admitting: Obstetrics & Gynecology

## 2020-10-16 ENCOUNTER — Other Ambulatory Visit: Payer: Self-pay

## 2020-10-16 ENCOUNTER — Ambulatory Visit (INDEPENDENT_AMBULATORY_CARE_PROVIDER_SITE_OTHER): Payer: Self-pay

## 2020-10-16 ENCOUNTER — Ambulatory Visit: Payer: Self-pay

## 2020-10-16 ENCOUNTER — Ambulatory Visit (INDEPENDENT_AMBULATORY_CARE_PROVIDER_SITE_OTHER): Payer: Self-pay | Admitting: Obstetrics

## 2020-10-16 VITALS — BP 100/60 | Wt 199.0 lb

## 2020-10-16 DIAGNOSIS — Z113 Encounter for screening for infections with a predominantly sexual mode of transmission: Secondary | ICD-10-CM

## 2020-10-16 DIAGNOSIS — Z3A13 13 weeks gestation of pregnancy: Secondary | ICD-10-CM

## 2020-10-16 DIAGNOSIS — O0991 Supervision of high risk pregnancy, unspecified, first trimester: Secondary | ICD-10-CM

## 2020-10-16 DIAGNOSIS — O99211 Obesity complicating pregnancy, first trimester: Secondary | ICD-10-CM

## 2020-10-16 DIAGNOSIS — Z3A18 18 weeks gestation of pregnancy: Secondary | ICD-10-CM

## 2020-10-16 DIAGNOSIS — Z3491 Encounter for supervision of normal pregnancy, unspecified, first trimester: Secondary | ICD-10-CM

## 2020-10-16 DIAGNOSIS — Z1379 Encounter for other screening for genetic and chromosomal anomalies: Secondary | ICD-10-CM

## 2020-10-16 LAB — POCT URINALYSIS DIPSTICK OB
Glucose, UA: NEGATIVE
POC,PROTEIN,UA: NEGATIVE

## 2020-10-16 LAB — URINE DRUG PANEL 7
Amphetamines, Urine: NEGATIVE ng/mL
Barbiturate Quant, Ur: NEGATIVE ng/mL
Benzodiazepine Quant, Ur: NEGATIVE ng/mL
Cannabinoid Quant, Ur: POSITIVE — AB
Cocaine (Metab.): NEGATIVE ng/mL
Opiate Quant, Ur: NEGATIVE ng/mL
PCP Quant, Ur: NEGATIVE ng/mL
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
## 2020-10-16 NOTE — Progress Notes (Signed)
C/o bad pain left lower rib area. rj

## 2020-10-18 LAB — GLUCOSE, 1 HOUR GESTATIONAL: Gestational Diabetes Screen: 108 mg/dL (ref 65–139)

## 2020-10-18 LAB — RPR+RH+ABO+RUB AB+AB SCR+CB...
Antibody Screen: NEGATIVE
HIV Screen 4th Generation wRfx: NONREACTIVE
Hematocrit: 38.2 % (ref 34.0–46.6)
Hemoglobin: 12.8 g/dL (ref 11.1–15.9)
Hepatitis B Surface Ag: NEGATIVE
MCH: 30.8 pg (ref 26.6–33.0)
MCHC: 33.5 g/dL (ref 31.5–35.7)
MCV: 92 fL (ref 79–97)
Platelets: 226 10*3/uL (ref 150–450)
RBC: 4.15 x10E6/uL (ref 3.77–5.28)
RDW: 11.7 % (ref 11.7–15.4)
RPR Ser Ql: NONREACTIVE
Rh Factor: POSITIVE
Rubella Antibodies, IGG: 1.87 index (ref >0.99)
Varicella zoster IgG: 1056 index (ref >165)
WBC: 10.4 10*3/uL (ref 3.4–10.8)

## 2020-10-23 ENCOUNTER — Telehealth: Payer: Self-pay | Admitting: Obstetrics

## 2020-10-23 NOTE — Telephone Encounter (Signed)
Patient calling requesting test results.

## 2020-10-24 LAB — MATERNIT 21 PLUS CORE, BLOOD
Fetal Fraction: 11
Result (T21): NEGATIVE
Trisomy 13 (Patau syndrome): NEGATIVE
Trisomy 18 (Edwards syndrome): NEGATIVE
Trisomy 21 (Down syndrome): NEGATIVE

## 2020-10-27 NOTE — Telephone Encounter (Signed)
I tried to contact this patient last evening, and there was no answer. Hr VM is also full so I could not leave a message.

## 2020-11-01 HISTORY — PX: WISDOM TOOTH EXTRACTION: SHX21

## 2020-11-13 ENCOUNTER — Ambulatory Visit (INDEPENDENT_AMBULATORY_CARE_PROVIDER_SITE_OTHER): Payer: Medicaid Other

## 2020-11-13 ENCOUNTER — Ambulatory Visit (INDEPENDENT_AMBULATORY_CARE_PROVIDER_SITE_OTHER): Payer: Medicaid Other | Admitting: Obstetrics and Gynecology

## 2020-11-13 ENCOUNTER — Other Ambulatory Visit: Payer: Self-pay

## 2020-11-13 VITALS — BP 110/62 | Wt 206.0 lb

## 2020-11-13 DIAGNOSIS — Z113 Encounter for screening for infections with a predominantly sexual mode of transmission: Secondary | ICD-10-CM

## 2020-11-13 DIAGNOSIS — Z3A18 18 weeks gestation of pregnancy: Secondary | ICD-10-CM

## 2020-11-13 DIAGNOSIS — N390 Urinary tract infection, site not specified: Secondary | ICD-10-CM

## 2020-11-13 DIAGNOSIS — R319 Hematuria, unspecified: Secondary | ICD-10-CM

## 2020-11-13 DIAGNOSIS — Z362 Encounter for other antenatal screening follow-up: Secondary | ICD-10-CM

## 2020-11-13 DIAGNOSIS — O0991 Supervision of high risk pregnancy, unspecified, first trimester: Secondary | ICD-10-CM | POA: Diagnosis not present

## 2020-11-13 DIAGNOSIS — O99211 Obesity complicating pregnancy, first trimester: Secondary | ICD-10-CM

## 2020-11-13 NOTE — Progress Notes (Signed)
Routine Prenatal Care Visit  Subjective  Gabriella Pennington is a 35 y.o. [phone] at [redacted]w[redacted]d being seen today for ongoing prenatal care.  She is currently monitored for the following issues for this high-risk pregnancy and has Supervision of high risk pregnancy in first trimester and Obesity affecting pregnancy in first trimester on their problem list.  ----------------------------------------------------------------------------------- Patient reports no complaints.   Contractions: Not present. Vag. Bleeding: None.  Movement: Present. Denies leaking of fluid.  ----------------------------------------------------------------------------------- The following portions of the patient's history were reviewed and updated as appropriate: allergies, current medications, past family history, past medical history, past social history, past surgical history and problem list. Problem list updated.   Objective  Blood pressure 110/62, weight 206 lb (93.4 kg), last menstrual period 07/17/2020. Pregravid weight 185 lb (83.9 kg) Total Weight Gain 21 lb (9.526 kg) Urinalysis:      Fetal Status: Fetal Heart Rate (bpm):         145   Movement: Present     General:  Alert, oriented and cooperative. Patient is in no acute distress.  Skin: Skin is warm and dry. No rash noted.   Cardiovascular: Normal heart rate noted  Respiratory: Normal respiratory effort, no problems with respiration noted  Abdomen: Soft, gravid, appropriate for gestational age. Pain/Pressure: Absent     Pelvic:  Cervical exam deferred        Extremities: Normal range of motion.     ental Status: Normal mood and affect. Normal behavior. Normal judgment and thought content.   US OB Comp Less 14 Wks  Result Date: 10/16/2020 Patient Name: KERYL GHOLSON DOB: [birth year] MRN: [id number] ULTRASOUND REPORT Location: [facility] Date of Service: 10/16/2020  Indications:dating Findings: Mason Jim intrauterine pregnancy is visualized with a CRL consistent with [redacted]w[redacted]d gestation, giving an (U/S) EDD of 04/15/2021. The (U/S) EDD is not consistent with the clinically established EDD of 04/23/2021. FHR: 148 BPM EFW: 89 g ( 3 oz ) Yolk sac is not visualized. Amnion: not visualized Right Ovary is not visualized. Left Ovary is normal appearance. Corpus luteal cyst:  is not visualized Survey of the adnexa demonstrates no adnexal masses. There is no free peritoneal fluid in the cul de sac. Impression: 1. [redacted]w[redacted]d Viable Singleton Intrauterine pregnancy by U/S, EDD: 04/15/2021 2. (U/S) EDD is not consistent with Clinically established EDD of 04/23/2021. Recommendations: 1.Clinical correlation with the patient's History and Physical Exam. 2. Change EDC to 04/15/21 Deanna Artis, RT Review of ULTRASOUND.    I have personally reviewed images and report of recent ultrasound done at [facility].    Plan of management to be discussed with patient. Annamarie Major, MD, [facility], [facility] 10/16/2020  4:34 PM   US OB Comp + 14 Wk  Result Date: 11/13/2020 Patient Name: VENECIA MEHL DOB: December 31, 1985 MRN: [id number] ULTRASOUND REPORT Location: [facility] Date of Service: 11/13/2020 Indications:Anatomy Ultrasound Findings: Mason Jim intrauterine pregnancy is visualized with FHR at 150 BPM. Biometrics give an (U/S) Gestational age of [redacted]w[redacted]d and an (U/S) EDD of 04/17/2021; this correlates with the clinically established Estimated Date of Delivery: 04/15/21 Fetal presentation is Cephalic. EFW: 219 g ( 8 oz ). Placenta: posterior. Grade: 1 AFI: subjectively normal. Anatomic survey is incomplete for DA, 3VV, RVOT, LVOT and normal; Gender - female.  Impression: 1. [redacted]w[redacted]d Viable Singleton Intrauterine pregnancy by U/S. 2. (U/S) EDD is consistent with Clinically established Estimated Date of Delivery: 04/15/21, [redacted]w[redacted]d . 3. The anatomy scan is incomplete for the structures  listed  above. Recommendations: 1.Clinical correlation with the patient's History and Physical Exam. Deanna Artis, RT  There is a singleton gestation with subjectively normal amniotic fluid volume. The fetal biometry correlates with established dating. Detailed evaluation of the fetal anatomy was performed.The fetal anatomical survey appears within normal limits within the resolution of ultrasound as described above, with incomplete heart views.  It must be noted that a normal ultrasound is unable to rule out fetal aneuploidy, subtle defects such as small ASD or VDS may also not be visible on imaging.  Vena Austria, MD, Evern Core [facility], [facility] 11/13/2020, 3:12 PM     Assessment   35 y.o. G4P3003 at [redacted]w[redacted]d by  04/15/2021, by Ultrasound presenting for routine prenatal visit  Plan   pregnancy Problems (from 10/03/20 to present)    Problem Noted Resolved   Supervision of high risk pregnancy in first trimester 10/07/2020 by Tresea Mall, CNM No   Overview Addendum 10/27/2020  6:24 PM by Mirna Mires, CNM    [facility]inic [facility] Prenatal Labs  Dating  Blood type:     Genetic Screen 1 Screen:    AFP:     Quad:     NIPS: MaternT testing- neg for Trisomy female Antibody:   Anatomic US Anatomy scan incomplete 11/14/2020 [ ]  Rubella:    Varicella: @VZVIGG @  GTT Early:                Third trimester:  RPR:     Rhogam  HBsAg:     Vaccines TDAP:                       Flu Shot: Covid: HIV:     Baby Food                                GBS:   GC/CT:  Contraception  Pap: 10/03/20 negative- had trich and BV at [facility] along with UTI- all treated  CBB     CS/VBAC Hx 3 previous c/sections   Support Person           Previous Version       Gestational age appropriate obstetric precautions including but not limited to vaginal bleeding, contractions, leaking of fluid and fetal movement were reviewed in detail with the patient.    - anatomy scan incomplete  Return in about 4 weeks  (around 12/11/2020) for ROB and follow up anatomy scan.  14/3/21, MD, 02/08/2021 OB/GYN, [facility] 11/13/2020, 3:33 PM

## 2020-11-14 ENCOUNTER — Other Ambulatory Visit: Payer: Self-pay | Admitting: Obstetrics

## 2020-11-17 LAB — URINE CULTURE

## 2020-11-18 ENCOUNTER — Other Ambulatory Visit: Payer: Self-pay | Admitting: Obstetrics and Gynecology

## 2020-11-18 MED ORDER — SULFAMETHOXAZOLE-TRIMETHOPRIM 800-160 MG PO TABS: 1.0000 | ORAL_TABLET | Freq: Two times a day (BID) | ORAL | 0 refills | Status: AC

## 2020-11-18 NOTE — Progress Notes (Signed)
Unable to reach by phone, left message to check mychart called in abx for UTI

## 2020-12-11 ENCOUNTER — Encounter: Payer: Self-pay | Admitting: Obstetrics & Gynecology

## 2020-12-11 ENCOUNTER — Ambulatory Visit (INDEPENDENT_AMBULATORY_CARE_PROVIDER_SITE_OTHER): Payer: Medicaid Other | Admitting: Obstetrics & Gynecology

## 2020-12-11 ENCOUNTER — Other Ambulatory Visit: Payer: Self-pay

## 2020-12-11 ENCOUNTER — Ambulatory Visit (INDEPENDENT_AMBULATORY_CARE_PROVIDER_SITE_OTHER): Payer: Medicaid Other

## 2020-12-11 VITALS — BP 100/60 | Wt 203.0 lb

## 2020-12-11 DIAGNOSIS — Z362 Encounter for other antenatal screening follow-up: Secondary | ICD-10-CM

## 2020-12-11 DIAGNOSIS — Z131 Encounter for screening for diabetes mellitus: Secondary | ICD-10-CM

## 2020-12-11 DIAGNOSIS — O0992 Supervision of high risk pregnancy, unspecified, second trimester: Secondary | ICD-10-CM

## 2020-12-11 DIAGNOSIS — O99212 Obesity complicating pregnancy, second trimester: Secondary | ICD-10-CM

## 2020-12-11 DIAGNOSIS — Z98891 History of uterine scar from previous surgery: Secondary | ICD-10-CM

## 2020-12-11 DIAGNOSIS — O0991 Supervision of high risk pregnancy, unspecified, first trimester: Secondary | ICD-10-CM

## 2020-12-11 DIAGNOSIS — Z3A22 22 weeks gestation of pregnancy: Secondary | ICD-10-CM

## 2020-12-11 DIAGNOSIS — O99211 Obesity complicating pregnancy, first trimester: Secondary | ICD-10-CM

## 2020-12-11 HISTORY — DX: History of uterine scar from previous surgery: Z98.891

## 2020-12-11 LAB — POCT URINALYSIS DIPSTICK OB
Glucose, UA: NEGATIVE
POC,PROTEIN,UA: NEGATIVE

## 2020-12-11 NOTE — Patient Instructions (Signed)

## 2020-12-11 NOTE — Progress Notes (Signed)
  Subjective  Fetal Movement? yes Contractions? no Leaking Fluid? no Vaginal Bleeding? no GERD sxs Objective  BP 100/60   Wt 203 lb (92.1 kg)   LMP 07/17/2020 (Within Weeks) Comment: spotting x 4days  BMI 35.96 kg/m  General: NAD Pumonary: no increased work of breathing Abdomen: gravid, non-tender Extremities: no edema Psychiatric: mood appropriate, affect full  Assessment  35 y.o. [id number] at [redacted]w[redacted]d by  04/15/2021, by Ultrasound presenting for routine prenatal visit  Plan   Problem List Items Addressed This Visit      Other   Supervision of high risk pregnancy in first trimester   Obesity affecting pregnancy in first trimester   History of 3 cesarean sections    Other Visit Diagnoses    [redacted] weeks gestation of pregnancy    -  Primary   Screening for diabetes mellitus       Relevant Orders   28 Week RH+Panel      pregnancy Problems (from 10/03/20 to present)    Problem Noted Resolved   Supervision of high risk pregnancy in first trimester 10/07/2020 by Tresea Mall, CNM No   Overview Addendum 12/11/2020 10:28 AM by Nadara Mustard, MD    Clinic [facility] Prenatal Labs  Dating [place] Blood type: O/Positive/-- (12/16 1024)   Genetic Screen NIPS: MaternT testing- neg for Trisomy female Antibody:Negative (12/16 1024)  Anatomic US Performed but incomplete. F/U WNL. Rubella: 1.87 (12/16 1024)  Varicella: Imm  GTT Early:WNL         Third trimester:  RPR: Non Reactive (12/16 1024)   Rhogam  HBsAg: Negative (12/16 1024)   Vaccines TDAP:                       Flu Shot: Covid: HIV: Non Reactive (12/16 1024)   Baby Food                                GBS:   GC/CT:  Contraception Considering BTL at CS Tubal papers [  ] Pap: 10/03/20 negative- had trich and BV at [facility] along with UTI- all treated  CBB     CS/VBAC Hx 3 previous c/sections Plan CS BTL 04/09/21  Support Person           L&D called and CS scheduled 04/09/21 am w PH  (did prior 3 CS and she desires this provider)  PNV   Glucola nv  Annamarie Major, MD, [facility], [facility] 12/11/2020  10:29 AM

## 2020-12-11 NOTE — Addendum Note (Signed)
Addended by: Cornelius Moras D on: 12/11/2020 10:33 AM   Modules accepted: Orders

## 2020-12-18 ENCOUNTER — Telehealth: Payer: Self-pay

## 2020-12-18 NOTE — Telephone Encounter (Signed)
-----   Message from Nadara Mustard, MD sent at 12/11/2020 10:30 AM EST ----- Regarding: Surgery Schedule 730 first case and delay my office til 900 this day (04/09/21)  Surgery Booking Request Patient Full Name:  Gabriella Pennington  MRN: [id number]  DOB: 05-03-1986  Surgeon: Letitia Libra, MD  Requested Surgery Date and Time: 04/09/21 Primary Diagnosis AND Code: Prior Cesarean, Sterility Secondary Diagnosis and Code:  Surgical Procedure: Cesarean section with tubal ligation RNFA Requested?: No L&D Notification: Yes Admission Status: surgery admit Length of Surgery: 50 min Special Case Needs: No H&P: Yes Phone Interview???:  Yes Interpreter: No Medical Clearance:  No Special Scheduling Instructions: No Any known health/anesthesia issues, diabetes, sleep apnea, latex allergy, defibrillator/pacemaker?: No Acuity: P1   (P1 highest, P2 delay may cause harm, P3 low, elective gyn, P4 lowest)

## 2020-12-18 NOTE — Telephone Encounter (Signed)
Scheduled pt for Cesarean section w tubal ligation w [facility] (clinic is blocked until 9am per [facility] request)  DOS 04/09/21  H&P 6/1 @ 9:00   Covid testing 6/8 @ 9-10 am  Pre-admit appointment to be requested.  Added note to next ROB appt - get BTL consent signed

## 2021-01-08 ENCOUNTER — Encounter: Payer: Medicaid Other | Admitting: Obstetrics

## 2021-01-08 ENCOUNTER — Other Ambulatory Visit: Payer: Medicaid Other

## 2021-02-03 ENCOUNTER — Other Ambulatory Visit: Payer: Self-pay | Admitting: Obstetrics & Gynecology

## 2021-02-03 DIAGNOSIS — Z131 Encounter for screening for diabetes mellitus: Secondary | ICD-10-CM

## 2021-02-09 ENCOUNTER — Other Ambulatory Visit (HOSPITAL_COMMUNITY)
Admission: RE | Admit: 2021-02-09 | Discharge: 2021-02-09 | Disposition: A | Discharge status: Home / Self Care | Payer: Medicaid Other | Source: Ambulatory Visit | Attending: Obstetrics and Gynecology | Admitting: Obstetrics and Gynecology

## 2021-02-09 ENCOUNTER — Other Ambulatory Visit: Payer: Medicaid Other

## 2021-02-09 ENCOUNTER — Other Ambulatory Visit: Payer: Self-pay

## 2021-02-09 ENCOUNTER — Ambulatory Visit (INDEPENDENT_AMBULATORY_CARE_PROVIDER_SITE_OTHER): Payer: Medicaid Other | Admitting: Obstetrics and Gynecology

## 2021-02-09 VITALS — BP 90/60 | Wt 191.0 lb

## 2021-02-09 DIAGNOSIS — Z113 Encounter for screening for infections with a predominantly sexual mode of transmission: Secondary | ICD-10-CM | POA: Diagnosis not present

## 2021-02-09 DIAGNOSIS — Z3A30 30 weeks gestation of pregnancy: Secondary | ICD-10-CM

## 2021-02-09 DIAGNOSIS — O0993 Supervision of high risk pregnancy, unspecified, third trimester: Secondary | ICD-10-CM

## 2021-02-09 DIAGNOSIS — O0991 Supervision of high risk pregnancy, unspecified, first trimester: Secondary | ICD-10-CM

## 2021-02-09 DIAGNOSIS — Z98891 History of uterine scar from previous surgery: Secondary | ICD-10-CM

## 2021-02-09 NOTE — Progress Notes (Signed)
Routine Prenatal Care Visit  Subjective  Gabriella Pennington is a 35 y.o. [phone] at [redacted]w[redacted]d being seen today for ongoing prenatal care.  She is currently monitored for the following issues for this high-risk pregnancy and has Supervision of high risk pregnancy in first trimester; Obesity affecting pregnancy in first trimester; and History of 3 cesarean sections on their problem list.  ----------------------------------------------------------------------------------- Patient reports no complaints.   Contractions: Not present. Vag. Bleeding: None.  Movement: Present. Denies leaking of fluid.  ----------------------------------------------------------------------------------- The following portions of the patient's history were reviewed and updated as appropriate: allergies, current medications, past family history, past medical history, past social history, past surgical history and problem list. Problem list updated.   Objective  Blood pressure 90/60, weight 191 lb (86.6 kg), last menstrual period 07/17/2020. Pregravid weight 185 lb (83.9 kg) Total Weight Gain 6 lb (2.722 kg) Urinalysis:      Fetal Status: Fetal Heart Rate (bpm): 145   Movement: Present     General:  Alert, oriented and cooperative. Patient is in no acute distress.  Skin: Skin is warm and dry. No rash noted.   Cardiovascular: Normal heart rate noted  Respiratory: Normal respiratory effort, no problems with respiration noted  Abdomen: Soft, gravid, appropriate for gestational age. Pain/Pressure: Absent     Pelvic:  Cervical exam deferred        Extremities: Normal range of motion.     ental Status: Normal mood and affect. Normal behavior. Normal judgment and thought content.     Assessment   35 y.o. G4P3003 at [redacted]w[redacted]d by  04/15/2021, by Ultrasound presenting for routine prenatal visit  Plan   pregnancy Problems (from 10/03/20 to present)    Problem Noted Resolved   Supervision of high risk pregnancy in first  trimester 10/07/2020 by Tresea Mall, CNM No   Overview Addendum 02/09/2021  3:30 PM by Zipporah Plants, CNM     Nursing Staff Provider  Office Location  [facility] Dating   14 week [place]  Language  English Anatomy US   complete, wnl  Flu Vaccine   declined Genetic Screen  NIPS:  Neg x3, XY   TDaP vaccine   declined 02/09/21 Hgb A1C or  GTT Early : wnl Third trimester :   Rhogam   n/a   LAB RESULTS   Feeding Plan   Blood Type O/Positive/-- (12/16 1024)   Contraception  considering BTL Antibody Negative (12/16 1024)  Circumcision  Rubella 1.87 (12/16 1024)  Pediatrician   RPR Non Reactive (12/16 1024)   Support Person  Elige Radon HBsAg Negative (12/16 1024)   Prenatal Classes  HIV Non Reactive (12/16 1024)    Varicella  immune   BTL Consent  signed 02/09/21 GBS  (For PCN allergy, check sensitivities)        VBAC Consent  hx of 3 c/s Pap  10/03/20     Hgb Electro   n/a    Plan CS BTL 04/09/21 CF      SMA               Previous Version      -1h GTT and third trimester labs today - reviewed process for screening for GDM -BTL consents completed today -TOC obtained for trich -Patient reports completing antibiotics in January for UTI  Preterm labor precautions including but not limited to vaginal bleeding, contractions, leaking of fluid and fetal movement were reviewed in detail with the patient.    Return in about 2 weeks (around 02/23/2021)  for ROB.  Zipporah Plants, CNM, MSN [facility], [facility] 02/09/2021, 3:31 PM

## 2021-02-10 ENCOUNTER — Telehealth: Payer: Self-pay

## 2021-02-10 LAB — 28 WEEK RH+PANEL
Basophils Absolute: 0.1 10*3/uL (ref 0.0–0.2)
Basos: 1 %
EOS (ABSOLUTE): 0.2 10*3/uL (ref 0.0–0.4)
Eos: 2 %
Gestational Diabetes Screen: 181 mg/dL — ABNORMAL HIGH (ref 65–139)
HIV Screen 4th Generation wRfx: NONREACTIVE
Hematocrit: 34.5 % (ref 34.0–46.6)
Hemoglobin: 11.8 g/dL (ref 11.1–15.9)
Immature Grans (Abs): 0.1 10*3/uL (ref 0.0–0.1)
Immature Granulocytes: 1 %
Lymphocytes Absolute: 1.7 10*3/uL (ref 0.7–3.1)
Lymphs: 17 %
MCH: 30.9 pg (ref 26.6–33.0)
MCHC: 34.2 g/dL (ref 31.5–35.7)
MCV: 90 fL (ref 79–97)
Monocytes Absolute: 0.8 10*3/uL (ref 0.1–0.9)
Monocytes: 8 %
Neutrophils Absolute: 6.8 10*3/uL (ref 1.4–7.0)
Neutrophils: 71 %
Platelets: 189 10*3/uL (ref 150–450)
RBC: 3.82 x10E6/uL (ref 3.77–5.28)
RDW: 12.2 % (ref 11.7–15.4)
RPR Ser Ql: NONREACTIVE
WBC: 9.7 10*3/uL (ref 3.4–10.8)

## 2021-02-10 NOTE — Telephone Encounter (Signed)
-----   Message from Nadara Mustard, MD sent at 02/10/2021  8:11 AM EDT ----- Sch 3 hour GTT testing

## 2021-02-10 NOTE — Telephone Encounter (Signed)
Called and left voicemail for patient to call back to be scheduled. 

## 2021-02-10 NOTE — Progress Notes (Signed)
Sch 3 hour GTT testing

## 2021-02-11 LAB — CERVICOVAGINAL ANCILLARY ONLY
Chlamydia: NEGATIVE
Comment: NEGATIVE
Comment: NEGATIVE
Comment: NORMAL
Neisseria Gonorrhea: NEGATIVE
Trichomonas: NEGATIVE

## 2021-02-23 ENCOUNTER — Encounter: Payer: Medicaid Other | Admitting: Advanced Practice Midwife

## 2021-04-01 ENCOUNTER — Encounter: Payer: Self-pay | Admitting: Obstetrics & Gynecology

## 2021-04-01 ENCOUNTER — Other Ambulatory Visit: Payer: Self-pay

## 2021-04-01 ENCOUNTER — Ambulatory Visit (INDEPENDENT_AMBULATORY_CARE_PROVIDER_SITE_OTHER): Payer: Medicaid Other | Admitting: Obstetrics & Gynecology

## 2021-04-01 VITALS — BP 120/80 | Ht 63.0 in | Wt 193.0 lb

## 2021-04-01 DIAGNOSIS — O0993 Supervision of high risk pregnancy, unspecified, third trimester: Secondary | ICD-10-CM

## 2021-04-01 DIAGNOSIS — Z3A38 38 weeks gestation of pregnancy: Secondary | ICD-10-CM

## 2021-04-01 DIAGNOSIS — Z98891 History of uterine scar from previous surgery: Secondary | ICD-10-CM

## 2021-04-01 NOTE — H&P (View-Only) (Signed)
PRE-OPERATIVE HISTORY AND PHYSICAL EXAM  HPI:  Gabriella Pennington is a 35 y.o. [phone].  Patient's last menstrual period was 07/17/2020 (within weeks).  [redacted]w[redacted]d Estimated Date of Delivery: 04/15/21  She is being admitted for Elective repeat.  She has had limited PNC this pregnancy mostly due to social reasons and being in detention; she is home now w children and FOB, in a reportedly safe environment.   PMHx: She  has a past medical history of Heart murmur. Also,  has a past surgical history that includes Cesarean section., family history is not on file.,  reports that she has been smoking cigarettes. She has been smoking about 0.50 packs per day. She has never used smokeless tobacco. She reports previous alcohol use of about 4.0 standard drinks of alcohol per week. She reports previous drug use. Drug: Marijuana. OB History  Gravida Para Term Preterm AB Living  4 3 3  0 0 3  SAB IAB Ectopic Multiple Live Births  0 0 0 0 3    # Outcome Date GA Lbr Len/2nd Weight Sex Delivery Anes PTL Lv  4 Current           3 Term 01/15/14    M      2 Term 10/02/07    M      1 Term 01/17/06    F      Patient denies any other pertinent gynecologic issues. See prenatal record for more complete H&P   Current Outpatient Medications:  .  Prenatal Vit-Fe Fumarate-FA (PRENATAL MULTIVITAMIN) TABS tablet, Take 1 tablet by mouth daily., Disp: , Rfl:  .  acetaminophen (TYLENOL) 500 MG tablet, Take 1,000 mg by mouth every 8 (eight) hours as needed for moderate pain. (Patient not taking: Reported on 04/01/2021), Disp: , Rfl:  .  calcium carbonate (TUMS EX) 750 MG chewable tablet, Chew 1 tablet by mouth daily as needed for heartburn. (Patient not taking: Reported on 04/01/2021), Disp: , Rfl:  Also, is allergic to penicillins.  Review of Systems  All other systems reviewed and are negative.   Objective: BP 120/80   Ht 5\' 3"  (1.6 m)   Wt 193 lb (87.5 kg)   LMP 07/17/2020 (Within Weeks) Comment: spotting x 4days  BMI  34.19 kg/m  Filed Weights   04/01/21 0925  Weight: 193 lb (87.5 kg)   Physical Exam Constitutional:      General: She is not in acute distress.    Appearance: She is well-developed.  HENT:     Head: Normocephalic and atraumatic. No laceration.     Right Ear: Hearing normal.     Left Ear: Hearing normal.     Mouth/Throat:     Pharynx: Uvula midline.  Eyes:     Pupils: Pupils are equal, round, and reactive to light.  Neck:     Thyroid: No thyromegaly.  Cardiovascular:     Rate and Rhythm: Normal rate and regular rhythm.     Heart sounds: No murmur heard. No friction rub. No gallop.   Pulmonary:     Effort: Pulmonary effort is normal. No respiratory distress.     Breath sounds: Normal breath sounds. No wheezing.  Abdominal:     General: Bowel sounds are normal. There is no distension.     Palpations: Abdomen is soft.     Tenderness: There is no abdominal tenderness. There is no rebound.     Comments: FHT 150s  Musculoskeletal:  General: Normal range of motion.     Cervical back: Normal range of motion and neck supple.  Neurological:     Mental Status: She is alert and oriented to person, place, and time.     Cranial Nerves: No cranial nerve deficit.  Skin:    General: Skin is warm and dry.  Psychiatric:        Judgment: Judgment normal.  Vitals reviewed.     Assessment: 1. Supervision of high risk pregnancy in third trimester   2. History of 3 cesarean sections   3. [redacted] weeks gestation of pregnancy   4.      Desire for sterility  PLAN: 1.  Cesarean Delivery as Scheduled. 2. Plan Tubal as well for sterility  Patient will undergo surgical management with Cesarean Section.   The risks of surgery were discussed in detail with the patient including but not limited to: bleeding which may require transfusion or reoperation; infection which may require antibiotics; injury to surrounding organs which may involve bowel, bladder, ureters ; need for additional procedures  including laparoscopy or laparotomy; thromboembolic phenomenon, surgical site problems and other postoperative/anesthesia complications. Likelihood of success in alleviating the patient's condition was discussed. Routine postoperative instructions will be reviewed with the patient and her family in detail after surgery.  The patient concurred with the proposed plan, giving informed written consent for the surgery.  Patient will be NPO procedure.  Preoperative prophylactic antibiotics, as necessary, and SCDs ordered on call to the OR.  The patient has been fully informed about all methods of contraception, both temporary and permanent. She understands that tubal ligation is meant to be permanent, absolute and irreversible. She was told that there is an approximately 1 in 400 chance of a pregnancy in the future after tubal ligation. She was told the short and long term complications of tubal ligation. She understands the risks from this surgery include, but are not limited to, the risks of anesthesia, hemorrhage, infection, perforation, and injury to adjacent structures, bowel, bladder and blood vessels.   Paul Cassey Bacigalupo, M.D. 04/01/2021 9:43 AM  

## 2021-04-01 NOTE — Progress Notes (Signed)
PRE-OPERATIVE HISTORY AND PHYSICAL EXAM  HPI:  Gabriella Pennington is a 35 y.o. [phone].  Patient's last menstrual period was 07/17/2020 (within weeks).  [redacted]w[redacted]d Estimated Date of Delivery: 04/15/21  She is being admitted for Elective repeat.  She has had limited PNC this pregnancy mostly due to social reasons and being in detention; she is home now w children and FOB, in a reportedly safe environment.   PMHx: She  has a past medical history of Heart murmur. Also,  has a past surgical history that includes Cesarean section., family history is not on file.,  reports that she has been smoking cigarettes. She has been smoking about 0.50 packs per day. She has never used smokeless tobacco. She reports previous alcohol use of about 4.0 standard drinks of alcohol per week. She reports previous drug use. Drug: Marijuana. OB History  Gravida Para Term Preterm AB Living  4 3 3  0 0 3  SAB IAB Ectopic Multiple Live Births  0 0 0 0 3    # Outcome Date GA Lbr Len/2nd Weight Sex Delivery Anes PTL Lv  4 Current           3 Term 01/15/14    M      2 Term 10/02/07    M      1 Term 01/17/06    F      Patient denies any other pertinent gynecologic issues. See prenatal record for more complete H&P   Current Outpatient Medications:  .  Prenatal Vit-Fe Fumarate-FA (PRENATAL MULTIVITAMIN) TABS tablet, Take 1 tablet by mouth daily., Disp: , Rfl:  .  acetaminophen (TYLENOL) 500 MG tablet, Take 1,000 mg by mouth every 8 (eight) hours as needed for moderate pain. (Patient not taking: Reported on 04/01/2021), Disp: , Rfl:  .  calcium carbonate (TUMS EX) 750 MG chewable tablet, Chew 1 tablet by mouth daily as needed for heartburn. (Patient not taking: Reported on 04/01/2021), Disp: , Rfl:  Also, is allergic to penicillins.  Review of Systems  All other systems reviewed and are negative.   Objective: BP 120/80   Ht 5\' 3"  (1.6 m)   Wt 193 lb (87.5 kg)   LMP 07/17/2020 (Within Weeks) Comment: spotting x 4days  BMI  34.19 kg/m  Filed Weights   04/01/21 0925  Weight: 193 lb (87.5 kg)   Physical Exam Constitutional:      General: She is not in acute distress.    Appearance: She is well-developed.  HENT:     Head: Normocephalic and atraumatic. No laceration.     Right Ear: Hearing normal.     Left Ear: Hearing normal.     Mouth/Throat:     Pharynx: Uvula midline.  Eyes:     Pupils: Pupils are equal, round, and reactive to light.  Neck:     Thyroid: No thyromegaly.  Cardiovascular:     Rate and Rhythm: Normal rate and regular rhythm.     Heart sounds: No murmur heard. No friction rub. No gallop.   Pulmonary:     Effort: Pulmonary effort is normal. No respiratory distress.     Breath sounds: Normal breath sounds. No wheezing.  Abdominal:     General: Bowel sounds are normal. There is no distension.     Palpations: Abdomen is soft.     Tenderness: There is no abdominal tenderness. There is no rebound.     Comments: FHT 150s  Musculoskeletal:  General: Normal range of motion.     Cervical back: Normal range of motion and neck supple.  Neurological:     Mental Status: She is alert and oriented to person, place, and time.     Cranial Nerves: No cranial nerve deficit.  Skin:    General: Skin is warm and dry.  Psychiatric:        Judgment: Judgment normal.  Vitals reviewed.     Assessment: 1. Supervision of high risk pregnancy in third trimester   2. History of 3 cesarean sections   3. [redacted] weeks gestation of pregnancy   4.      Desire for sterility  PLAN: 1.  Cesarean Delivery as Scheduled. 2. Plan Tubal as well for sterility  Patient will undergo surgical management with Cesarean Section.   The risks of surgery were discussed in detail with the patient including but not limited to: bleeding which may require transfusion or reoperation; infection which may require antibiotics; injury to surrounding organs which may involve bowel, bladder, ureters ; need for additional procedures  including laparoscopy or laparotomy; thromboembolic phenomenon, surgical site problems and other postoperative/anesthesia complications. Likelihood of success in alleviating the patient's condition was discussed. Routine postoperative instructions will be reviewed with the patient and her family in detail after surgery.  The patient concurred with the proposed plan, giving informed written consent for the surgery.  Patient will be NPO procedure.  Preoperative prophylactic antibiotics, as necessary, and SCDs ordered on call to the OR.  The patient has been fully informed about all methods of contraception, both temporary and permanent. She understands that tubal ligation is meant to be permanent, absolute and irreversible. She was told that there is an approximately 1 in 400 chance of a pregnancy in the future after tubal ligation. She was told the short and long term complications of tubal ligation. She understands the risks from this surgery include, but are not limited to, the risks of anesthesia, hemorrhage, infection, perforation, and injury to adjacent structures, bowel, bladder and blood vessels.   Annamarie Major, M.D. 04/01/2021 9:43 AM

## 2021-04-01 NOTE — Patient Instructions (Addendum)
PRE ADMISSION TESTING For Covid, prior to procedure Wed 04/08/21 - (8:30 testing) [facility]  Results in 48-72 hours You will not receive notification if test results are negative. If positive for Covid19, your provider will notify you by phone, with additional instructions.   Cesarean Delivery, Care After This sheet gives you information about how to care for yourself after your procedure. Your health care provider may also give you more specific instructions. If you have problems or questions, contact your health care provider. What can I expect after the procedure? After the procedure, it is common to have:  A small amount of blood or clear fluid coming from the incision.  Some redness, swelling, and pain in your incision area.  Some abdominal pain and soreness.  Vaginal bleeding (lochia). Even though you did not have a vaginal delivery, you will still have vaginal bleeding and discharge.  Pelvic cramps.  Fatigue. You may have pain, swelling, and discomfort in the tissue between your vagina and your anus (perineum) if:  Your C-section was unplanned, and you were allowed to labor and push.  An incision was made in the area (episiotomy) or the tissue tore during attempted vaginal delivery. Follow these instructions at home: Incision care  Follow instructions from your health care provider about how to take care of your incision. Make sure you: ? Wash your hands with soap and water before you change your bandage (dressing). If soap and water are not available, use hand sanitizer. ? If you have a dressing, change it or remove it as told by your health care provider. ? Leave stitches (sutures), skin staples, skin glue, or adhesive strips in place. These skin closures may need to stay in place for 2 weeks or longer. If adhesive strip edges start to loosen and curl up, you may trim the loose edges. Do not remove adhesive strips completely unless your health care  provider tells you to do that.  Check your incision area every day for signs of infection. Check for: ? More redness, swelling, or pain. ? More fluid or blood. ? Warmth. ? Pus or a bad smell.  Do not take baths, swim, or use a hot tub until your health care provider says it's okay. Ask your health care provider if you can take showers.  When you cough or sneeze, hug a pillow. This helps with pain and decreases the chance of your incision opening up (dehiscing). Do this until your incision heals.   Medicines  Take over-the-counter and prescription medicines only as told by your health care provider.  If you were prescribed an antibiotic medicine, take it as told by your health care provider. Do not stop taking the antibiotic even if you start to feel better.  Do not drive or use heavy machinery while taking prescription pain medicine. Lifestyle  Do not drink alcohol. This is especially important if you are breastfeeding or taking pain medicine.  Do not use any products that contain nicotine or tobacco, such as cigarettes, e-cigarettes, and chewing tobacco. If you need help quitting, ask your health care provider. Eating and drinking  Drink at least 8 eight-ounce glasses of water every day unless told not to by your health care provider. If you breastfeed, you may need to drink even more water.  Eat high-fiber foods every day. These foods may help prevent or relieve constipation. High-fiber foods include: ? Whole grain cereals and breads. ? Brown rice. ? Beans. ? Fresh fruits and vegetables. Activity  If  possible, have someone help you care for your baby and help with household activities for at least a few days after you leave the hospital.  Return to your normal activities as told by your health care provider. Ask your health care provider what activities are safe for you.  Rest as much as possible. Try to rest or take a nap while your baby is sleeping.  Do not lift anything  that is heavier than 10 lbs (4.5 kg), or the limit that you were told, until your health care provider says that it is safe.  Talk with your health care provider about when you can engage in sexual activity. This may depend on your: ? Risk of infection. ? How fast you heal. ? Comfort and desire to engage in sexual activity.   General instructions  Do not use tampons or douches until your health care provider approves.  Wear loose, comfortable clothing and a supportive and well-fitting bra.  Keep your perineum clean and dry. Wipe from front to back when you use the toilet.  If you pass a blood clot, save it and call your health care provider to discuss. Do not flush blood clots down the toilet before you get instructions from your health care provider.  Keep all follow-up visits for you and your baby as told by your health care provider. This is important. Contact a health care provider if:  You have: ? A fever. ? Bad-smelling vaginal discharge. ? Pus or a bad smell coming from your incision. ? Difficulty or pain when urinating. ? A sudden increase or decrease in the frequency of your bowel movements. ? More redness, swelling, or pain around your incision. ? More fluid or blood coming from your incision. ? A rash. ? Nausea. ? Little or no interest in activities you used to enjoy. ? Questions about caring for yourself or your baby.  Your incision feels warm to the touch.  Your breasts turn red or become painful or hard.  You feel unusually sad or worried.  You vomit.  You pass a blood clot from your vagina.  You urinate more than usual.  You are dizzy or light-headed. Get help right away if:  You have: ? Pain that does not go away or get better with medicine. ? Chest pain. ? Difficulty breathing. ? Blurred vision or spots in your vision. ? Thoughts about hurting yourself or your baby. ? New pain in your abdomen or in one of your legs. ? A severe headache.  You  faint.  You bleed from your vagina so much that you fill more than one sanitary pad in one hour. Bleeding should not be heavier than your heaviest period. Summary  After the procedure, it is common to have pain at your incision site, abdominal cramping, and slight bleeding from your vagina.  Check your incision area every day for signs of infection.  Tell your health care provider about any unusual symptoms.  Keep all follow-up visits for you and your baby as told by your health care provider. This information is not intended to replace advice given to you by your health care provider. Make sure you discuss any questions you have with your health care provider. Document Revised: 04/26/2018 Document Reviewed: 04/26/2018 Elsevier Patient Education  2021 [employer].

## 2021-04-02 ENCOUNTER — Other Ambulatory Visit
Admission: RE | Admit: 2021-04-02 | Discharge: 2021-04-02 | Disposition: A | Discharge status: Home / Self Care | Payer: Medicaid Other | Source: Ambulatory Visit | Attending: Obstetrics & Gynecology | Admitting: Obstetrics & Gynecology

## 2021-04-02 ENCOUNTER — Other Ambulatory Visit: Payer: Self-pay | Admitting: Obstetrics & Gynecology

## 2021-04-02 ENCOUNTER — Other Ambulatory Visit: Payer: Self-pay

## 2021-04-02 HISTORY — DX: COVID-19: U07.1

## 2021-04-02 NOTE — Patient Instructions (Addendum)
Your procedure is scheduled on: 04/09/21 Report to [facility] Emergency Department at 0530 am.  REMEMBER: Instructions that are not followed completely may result in serious medical risk, up to and including death; or upon the discretion of your surgeon and anesthesiologist your surgery may need to be rescheduled.  Do not eat food or drink any fluids after midnight the night before surgery.  No gum chewing, lozengers or hard candies.   TAKE THESE MEDICATIONS THE MORNING OF SURGERY WITH A SIP OF WATER: NONE  One week prior to surgery: Stop Anti-inflammatories (NSAIDS) such as Advil, Aleve, Ibuprofen, Motrin, Naproxen, Naprosyn and Aspirin based products such as Excedrin, Goodys Powder, BC Powder.  Stop ANY OVER THE COUNTER supplements until after surgery.  You may however, continue to take Tylenol if needed for pain up until the day of surgery.  No Alcohol for 24 hours before or after surgery.  No Smoking including e-cigarettes for 24 hours prior to surgery.  No chewable tobacco products for at least 6 hours prior to surgery.  No nicotine patches on the day of surgery.  Do not use any "recreational" drugs for at least a week prior to your surgery.  Please be advised that the combination of cocaine and anesthesia may have negative outcomes, up to and including death. If you test positive for cocaine, your surgery will be cancelled.  On the morning of surgery brush your teeth with toothpaste and water, you may rinse your mouth with mouthwash if you wish. Do not swallow any toothpaste or mouthwash.  Do not wear jewelry, make-up, hairpins, clips or nail polish.  Do not wear lotions, powders, or perfumes.   Do not shave body from the neck down 48 hours prior to surgery just in case you cut yourself which could leave a site for infection.  Also, freshly shaved skin may become irritated if using the CHG soap.  Contact lenses, hearing aids and dentures may not be  worn into surgery.  Do not bring valuables to the hospital. [facility] is not responsible for any missing/lost belongings or valuables.   Use CHG Soap or wipes as directed on instruction sheet.  Notify your doctor if there is any change in your medical condition (cold, fever, infection).  Wear comfortable clothing (specific to your surgery type) to the hospital.  Plan for stool softeners for home use; pain medications have a tendency to cause constipation. You can also help prevent constipation by eating foods high in fiber such as fruits and vegetables and drinking plenty of fluids as your diet allows.  After surgery, you can help prevent lung complications by doing breathing exercises.  Take deep breaths and cough every 1-2 hours. Your doctor may order a device called an Incentive Spirometer to help you take deep breaths. When coughing or sneezing, hold a pillow firmly against your incision with both hands. This is called "splinting." Doing this helps protect your incision. It also decreases belly discomfort.  If you are being admitted to the hospital overnight, leave your suitcase in the car. After surgery it may be brought to your room.  If you are being discharged the day of surgery, you will not be allowed to drive home. You will need a responsible adult (18 years or older) to drive you home and stay with you that night.   If you are taking public transportation, you will need to have a responsible adult (18 years or older) with you. Please confirm with your physician that it  is acceptable to use public transportation.   Please call the Pre-admissions Testing Dept. at [phone] if you have any questions about these instructions.  Surgery Visitation Policy for Mother Baby:  One dedicated partner is allowed to accompany the patient to the OR, a second visitor is allowed in Mother Baby care unit. Those two visitors must be the same person for the duration of the patients  stay.   Inpatient Visitation:    Visiting hours are 7 a.m. to 8 p.m. Inpatients will be allowed two visitors daily. The visitors may change each day during the patient's stay. No visitors under the age of 45. Any visitor under the age of 34 must be accompanied by an adult. The visitor must pass COVID-19 screenings, use hand sanitizer when entering and exiting the patient's room and wear a mask at all times, including in the patient's room. Patients must also wear a mask when staff or their visitor are in the room. Masking is required regardless of vaccination status.

## 2021-04-07 ENCOUNTER — Other Ambulatory Visit
Admission: RE | Admit: 2021-04-07 | Discharge: 2021-04-07 | Disposition: A | Discharge status: Home / Self Care | Payer: Medicaid Other | Source: Ambulatory Visit | Attending: Obstetrics & Gynecology | Admitting: Obstetrics & Gynecology

## 2021-04-07 NOTE — Progress Notes (Signed)
  Perioperative Services Pre-Admission/Anesthesia Testing   Date: 04/07/21 Name: Gabriella Pennington MRN:   [id number]  Re: Consideration of preoperative prophylactic antibiotic change   Request sent to: Nadara Mustard, MD (routed and/or faxed via [facility])  Planned Surgical Procedure(s):    Notes: 1. Patient has a documented allergy to PCN  . Advising that PCN has caused her to experience urticarial rash in the past.   2. Received cephalosporin with no documented complications . CEPHALEXIN course received on 10/07/2020  3. Screened as appropriate for cephalosporin use during medication reconciliation . No immediate angioedema, dysphagia, SOB, anaphylaxis symptoms. . No severe rash involving mucous membranes or skin necrosis. . No hospital admissions related to side effects of PCN/cephalosporin use.  . No documented reaction to PCN or cephalosporin in the last 10 years.  Request:  As an evidence based approach to reducing the rate of incidence for post-operative SSI and the development of MDROs, could an agent with narrower coverage for preoperative prophylaxis in this patient's upcoming surgical course be considered?   1. Currently ordered preoperative prophylactic ABX: Clindamycin + gentamicin.   2. Specifically requesting change to cephalosporin (CEFAZOLIN).   3. Please communicate decision with me and I will change the orders in Epic as per your direction.   Things to consider:  Many patients report that they were "allergic" to PCN earlier in life, however this does not translate into a true lifelong allergy. Patients can lose sensitivity to specific IgE antibodies over time if PCN is avoided (Kleris & Lugar, 2019).   Up to 10% of the adult population and 15% of hospitalized patients report an allergy to PCN, however clinical studies suggest that 90% of those reporting an allergy can tolerate PCN antibiotics (Kleris & Lugar, 2019).   Cross-sensitivity between PCN and  cephalosporins has been documented as being as high as 10%, however this estimation included data believed to have been collected in a setting where there was contamination. Newer data suggests that the prevalence of cross-sensitivity between PCN and cephalosporins is actually estimated to be closer to 1% (Hermanides et al., 2018).    Patients labeled as PCN allergic, whether they are truly allergic or not, have been found to have inferior outcomes in terms of rates of serious infection, and these patients tend to have longer hospital stays [facility] & Lugar, 2019).   Treatment related secondary infections, such as Clostridioides difficile, have been linked to the improper use of broad spectrum antibiotics in patients improperly labeled as PCN allergic (Kleris & Lugar, 2019).   Anaphylaxis from cephalosporins is rare and the evidence suggests that there is no increased risk of an anaphylactic type reaction when cephalosporins are used in a PCN allergic patient (Pichichero, 2006).  Citations: Hermanides J, Lemkes BA, Prins Gwenyth Bender MW, Terreehorst I. Presumed ?-Lactam Allergy and Cross-reactivity in the Operating Theater: A Practical Approach. Anesthesiology. 2018 Aug;129(2):335-342. doi: 10.1097/ALN.0000000000002252. PMID: 98921194.  Kleris, R. S., & Lugar, P. L. (2019). Things We Do For No Reason: Failing to Question a Penicillin Allergy History. Journal of hospital medicine, 14(10), [phone]. Advance online publication. airportbarriers.com  Pichichero, M. E. (2006). Cephalosporins can be prescribed safely for penicillin-allergic patients. Journal of family medicine, 55(2), 106-112. Accessed: https://cdn.mdedge.com/files/s81fs-public/Document/September-2017/5502JFP_AppliedEvidence1.pdf   Quentin Mulling, MSN, APRN, FNP-C, CEN [facility]  Peri-operative Services Nurse Practitioner FAX: [phone] 04/07/21 12:00 PM

## 2021-04-08 ENCOUNTER — Encounter
Admission: RE | Admit: 2021-04-08 | Discharge: 2021-04-08 | Disposition: A | Discharge status: Home / Self Care | Payer: Medicaid Other | Source: Ambulatory Visit | Attending: Obstetrics & Gynecology | Admitting: Obstetrics & Gynecology

## 2021-04-08 ENCOUNTER — Other Ambulatory Visit
Admission: RE | Admit: 2021-04-08 | Discharge: 2021-04-08 | Disposition: A | Discharge status: Home / Self Care | Payer: Medicaid Other | Source: Ambulatory Visit | Attending: Obstetrics & Gynecology | Admitting: Obstetrics & Gynecology

## 2021-04-08 ENCOUNTER — Other Ambulatory Visit: Payer: Self-pay

## 2021-04-08 DIAGNOSIS — Z01812 Encounter for preprocedural laboratory examination: Secondary | ICD-10-CM | POA: Insufficient documentation

## 2021-04-08 DIAGNOSIS — Z20822 Contact with and (suspected) exposure to covid-19: Secondary | ICD-10-CM | POA: Insufficient documentation

## 2021-04-08 LAB — TYPE AND SCREEN
ABO/RH(D): O POS
Antibody Screen: NEGATIVE
Extend sample reason: UNDETERMINED

## 2021-04-08 LAB — CBC
HCT: 38.6 % (ref 36.0–46.0)
Hemoglobin: 12.9 g/dL (ref 12.0–15.0)
MCH: 30.3 pg (ref 26.0–34.0)
MCHC: 33.4 g/dL (ref 30.0–36.0)
MCV: 90.6 fL (ref 80.0–100.0)
Platelets: 187 10*3/uL (ref 150–400)
RBC: 4.26 MIL/uL (ref 3.87–5.11)
RDW: 14.4 % (ref 11.5–15.5)
WBC: 12.4 10*3/uL — ABNORMAL HIGH (ref 4.0–10.5)
nRBC: 0 % (ref 0.0–0.2)

## 2021-04-08 LAB — RAPID HIV SCREEN (HIV 1/2 AB+AG)
HIV 1/2 Antibodies: NONREACTIVE
HIV-1 P24 Antigen - HIV24: NONREACTIVE

## 2021-04-08 LAB — SARS CORONAVIRUS 2 (TAT 6-24 HRS): SARS Coronavirus 2: NEGATIVE

## 2021-04-08 NOTE — Progress Notes (Signed)
  Perioperative Services Pre-Admission/Anesthesia Testing     Date: 04/08/21  Name: Gabriella Pennington MRN:   [id number]  Re: Change in ABX for upcoming surgery   Case: [id number] Date/Time: 04/09/21 0730   Procedure: CESAREAN SECTION WITH BILATERAL TUBAL LIGATION (Bilateral )   Anesthesia type: Choice   Pre-op diagnosis: Prior Cesarean, Sterility   Location: [facility] C-SECTION RM 02 / [facility] ORS FOR ANESTHESIA GROUP   Surgeons: Nadara Mustard, MD    Primary attending surgeon was consulted regarding consideration of therapeutic change in antimicrobial agent being used for preoperative prophylaxis in this patient's upcoming surgical case. Following analysis of the risk versus benefits, Dr. Tiburcio Pea, Harrel Lemon, MD advising that it would be acceptable to discontinue the ordered clindymycin + gentamicin and place an order for cefazolin 2 gm IV on call to the OR. Orders for this patient were amended by me following collaborative conversation with attending surgeon.  Quentin Mulling, MSN, APRN, FNP-C, CEN [facility]  Peri-operative Services Nurse Practitioner Phone: [phone] 04/08/21 7:58 AM

## 2021-04-09 ENCOUNTER — Encounter
Admission: RE | Disposition: A | Discharge status: Home / Self Care | Payer: Self-pay | Source: Ambulatory Visit | Attending: Obstetrics & Gynecology

## 2021-04-09 ENCOUNTER — Inpatient Hospital Stay: Payer: Medicaid Other | Admitting: Anesthesiology

## 2021-04-09 ENCOUNTER — Encounter: Payer: Self-pay | Admitting: Obstetrics and Gynecology

## 2021-04-09 ENCOUNTER — Inpatient Hospital Stay
Admission: RE | Admit: 2021-04-09 | Discharge: 2021-04-11 | DRG: 787 | Disposition: A | Discharge status: Home / Self Care | Payer: Medicaid Other | Source: Ambulatory Visit | Attending: Obstetrics & Gynecology | Admitting: Obstetrics & Gynecology

## 2021-04-09 DIAGNOSIS — Z3A39 39 weeks gestation of pregnancy: Secondary | ICD-10-CM | POA: Diagnosis not present

## 2021-04-09 DIAGNOSIS — Z880 Allergy status to penicillin: Secondary | ICD-10-CM | POA: Diagnosis not present

## 2021-04-09 DIAGNOSIS — O99334 Smoking (tobacco) complicating childbirth: Secondary | ICD-10-CM | POA: Diagnosis present

## 2021-04-09 DIAGNOSIS — F17210 Nicotine dependence, cigarettes, uncomplicated: Secondary | ICD-10-CM | POA: Diagnosis present

## 2021-04-09 DIAGNOSIS — F1420 Cocaine dependence, uncomplicated: Secondary | ICD-10-CM | POA: Diagnosis present

## 2021-04-09 DIAGNOSIS — O34211 Maternal care for low transverse scar from previous cesarean delivery: Principal | ICD-10-CM | POA: Diagnosis present

## 2021-04-09 DIAGNOSIS — O99324 Drug use complicating childbirth: Secondary | ICD-10-CM | POA: Diagnosis present

## 2021-04-09 DIAGNOSIS — Z98891 History of uterine scar from previous surgery: Secondary | ICD-10-CM

## 2021-04-09 DIAGNOSIS — Z3A38 38 weeks gestation of pregnancy: Secondary | ICD-10-CM | POA: Diagnosis not present

## 2021-04-09 DIAGNOSIS — F1920 Other psychoactive substance dependence, uncomplicated: Secondary | ICD-10-CM | POA: Diagnosis present

## 2021-04-09 DIAGNOSIS — O34219 Maternal care for unspecified type scar from previous cesarean delivery: Secondary | ICD-10-CM | POA: Diagnosis not present

## 2021-04-09 DIAGNOSIS — Z20822 Contact with and (suspected) exposure to covid-19: Secondary | ICD-10-CM | POA: Diagnosis present

## 2021-04-09 DIAGNOSIS — O0991 Supervision of high risk pregnancy, unspecified, first trimester: Secondary | ICD-10-CM

## 2021-04-09 DIAGNOSIS — F1220 Cannabis dependence, uncomplicated: Secondary | ICD-10-CM | POA: Diagnosis present

## 2021-04-09 HISTORY — DX: History of uterine scar from previous surgery: Z98.891

## 2021-04-09 LAB — URINE DRUG SCREEN, QUALITATIVE (ARMC ONLY)
Amphetamines, Ur Screen: NOT DETECTED
Barbiturates, Ur Screen: NOT DETECTED
Benzodiazepine, Ur Scrn: NOT DETECTED
Cannabinoid 50 Ng, Ur ~~LOC~~: POSITIVE — AB
Cocaine Metabolite,Ur ~~LOC~~: POSITIVE — AB
MDMA (Ecstasy)Ur Screen: NOT DETECTED
Methadone Scn, Ur: NOT DETECTED
Opiate, Ur Screen: NOT DETECTED
Phencyclidine (PCP) Ur S: NOT DETECTED
Tricyclic, Ur Screen: NOT DETECTED

## 2021-04-09 LAB — RPR: RPR Ser Ql: NONREACTIVE

## 2021-04-09 LAB — ABO/RH: ABO/RH(D): O POS

## 2021-04-09 SURGERY — Surgical Case
Anesthesia: Spinal | Laterality: Bilateral

## 2021-04-09 MED ORDER — NALBUPHINE HCL 10 MG/ML IJ SOLN: 5.0000 mg | Freq: Once | INTRAMUSCULAR | Status: DC | PRN

## 2021-04-09 MED ORDER — BUPIVACAINE 0.25 % ON-Q PUMP DUAL CATH 400 ML
400.0000 mL | INJECTION | Status: DC
  Filled 2021-04-09: qty 400

## 2021-04-09 MED ORDER — SOD CITRATE-CITRIC ACID 500-334 MG/5ML PO SOLN
30.0000 mL | ORAL | Status: AC
  Administered 2021-04-09: 30 mL via ORAL
  Filled 2021-04-09: qty 15

## 2021-04-09 MED ORDER — MORPHINE SULFATE (PF) 0.5 MG/ML IJ SOLN
INTRAMUSCULAR | Status: AC
  Filled 2021-04-09: qty 10

## 2021-04-09 MED ORDER — SODIUM CHLORIDE 0.9% FLUSH: 3.0000 mL | INTRAVENOUS | Status: DC | PRN

## 2021-04-09 MED ORDER — MISOPROSTOL 200 MCG PO TABS
ORAL_TABLET | ORAL | Status: AC
  Filled 2021-04-09: qty 5

## 2021-04-09 MED ORDER — EPHEDRINE 5 MG/ML INJ
INTRAVENOUS | Status: AC
  Filled 2021-04-09: qty 4

## 2021-04-09 MED ORDER — OXYCODONE-ACETAMINOPHEN 5-325 MG PO TABS
1.0000 | ORAL_TABLET | ORAL | Status: DC | PRN
  Administered 2021-04-10: 1 via ORAL
  Administered 2021-04-10: 2 via ORAL
  Administered 2021-04-10 – 2021-04-11 (×2): 1 via ORAL
  Administered 2021-04-11: 2 via ORAL
  Filled 2021-04-09 (×2): qty 2
  Filled 2021-04-09 (×3): qty 1

## 2021-04-09 MED ORDER — BUPIVACAINE HCL (PF) 0.5 % IJ SOLN
INTRAMUSCULAR | Status: AC
  Filled 2021-04-09: qty 30

## 2021-04-09 MED ORDER — ACETAMINOPHEN 500 MG PO TABS
1000.0000 mg | ORAL_TABLET | Freq: Four times a day (QID) | ORAL | Status: AC
  Administered 2021-04-09 – 2021-04-10 (×3): 1000 mg via ORAL
  Filled 2021-04-09 (×3): qty 2

## 2021-04-09 MED ORDER — NALOXONE HCL 0.4 MG/ML IJ SOLN
0.4000 mg | INTRAMUSCULAR | Status: DC | PRN
Start: 2021-04-09 — End: 2021-04-11

## 2021-04-09 MED ORDER — LACTATED RINGERS IV SOLN: INTRAVENOUS | Status: DC

## 2021-04-09 MED ORDER — CEFAZOLIN SODIUM-DEXTROSE 2-4 GM/100ML-% IV SOLN
2.0000 g | Freq: Once | INTRAVENOUS | Status: AC
  Administered 2021-04-09: 2 g via INTRAVENOUS
  Filled 2021-04-09: qty 100

## 2021-04-09 MED ORDER — ZOLPIDEM TARTRATE 5 MG PO TABS: 5.0000 mg | ORAL_TABLET | Freq: Every evening | ORAL | Status: DC | PRN

## 2021-04-09 MED ORDER — FENTANYL CITRATE (PF) 100 MCG/2ML IJ SOLN
INTRAMUSCULAR | Status: AC
  Filled 2021-04-09: qty 2

## 2021-04-09 MED ORDER — MORPHINE SULFATE (PF) 2 MG/ML IV SOLN: 1.0000 mg | INTRAVENOUS | Status: DC | PRN

## 2021-04-09 MED ORDER — ONDANSETRON HCL 4 MG/2ML IJ SOLN
INTRAMUSCULAR | Status: AC
  Filled 2021-04-09: qty 2

## 2021-04-09 MED ORDER — DIPHENHYDRAMINE HCL 25 MG PO CAPS: 25.0000 mg | ORAL_CAPSULE | Freq: Four times a day (QID) | ORAL | Status: DC | PRN

## 2021-04-09 MED ORDER — KETOROLAC TROMETHAMINE 30 MG/ML IJ SOLN
INTRAMUSCULAR | Status: AC
  Filled 2021-04-09: qty 1

## 2021-04-09 MED ORDER — FENTANYL CITRATE (PF) 100 MCG/2ML IJ SOLN
INTRAMUSCULAR | Status: DC | PRN
  Administered 2021-04-09: 15 ug via INTRAVENOUS

## 2021-04-09 MED ORDER — LACTATED RINGERS IV BOLUS
500.0000 mL | Freq: Once | INTRAVENOUS | Status: AC
  Administered 2021-04-09: 500 mL via INTRAVENOUS

## 2021-04-09 MED ORDER — OXYTOCIN-SODIUM CHLORIDE 30-0.9 UT/500ML-% IV SOLN
INTRAVENOUS | Status: AC
  Administered 2021-04-09: 2.5 [IU]/h via INTRAVENOUS
  Filled 2021-04-09: qty 1000

## 2021-04-09 MED ORDER — KETOROLAC TROMETHAMINE 30 MG/ML IJ SOLN: 30.0000 mg | Freq: Four times a day (QID) | INTRAMUSCULAR | Status: DC

## 2021-04-09 MED ORDER — WITCH HAZEL-GLYCERIN EX PADS
1.0000 | MEDICATED_PAD | CUTANEOUS | Status: DC | PRN
Start: 2021-04-09 — End: 2021-04-11

## 2021-04-09 MED ORDER — BUPIVACAINE HCL (PF) 0.5 % IJ SOLN
INTRAMUSCULAR | Status: DC | PRN
  Administered 2021-04-09: 10 mL via PERINEURAL

## 2021-04-09 MED ORDER — MEDROXYPROGESTERONE ACETATE 150 MG/ML IM SUSP
150.0000 mg | INTRAMUSCULAR | Status: AC | PRN
  Administered 2021-04-11: 150 mg via INTRAMUSCULAR
  Filled 2021-04-09 (×2): qty 1

## 2021-04-09 MED ORDER — COCONUT OIL OIL
1.0000 | TOPICAL_OIL | Status: DC | PRN
Start: 2021-04-09 — End: 2021-04-11

## 2021-04-09 MED ORDER — KETOROLAC TROMETHAMINE 30 MG/ML IJ SOLN
30.0000 mg | Freq: Four times a day (QID) | INTRAMUSCULAR | Status: AC
  Administered 2021-04-09 – 2021-04-10 (×3): 30 mg via INTRAVENOUS
  Filled 2021-04-09 (×3): qty 1

## 2021-04-09 MED ORDER — PHENYLEPHRINE HCL (PRESSORS) 10 MG/ML IV SOLN
INTRAVENOUS | Status: DC | PRN
  Administered 2021-04-09 (×2): 100 ug via INTRAVENOUS

## 2021-04-09 MED ORDER — OXYCODONE HCL 5 MG PO TABS: 5.0000 mg | ORAL_TABLET | ORAL | Status: DC | PRN

## 2021-04-09 MED ORDER — NALBUPHINE HCL 10 MG/ML IJ SOLN: 5.0000 mg | INTRAMUSCULAR | Status: DC | PRN

## 2021-04-09 MED ORDER — EPINEPHRINE PF 1 MG/ML IJ SOLN
INTRAMUSCULAR | Status: AC
  Filled 2021-04-09: qty 1

## 2021-04-09 MED ORDER — METHYLERGONOVINE MALEATE 0.2 MG/ML IJ SOLN
INTRAMUSCULAR | Status: AC
  Filled 2021-04-09: qty 1

## 2021-04-09 MED ORDER — PRENATAL MULTIVITAMIN CH
1.0000 | ORAL_TABLET | Freq: Every day | ORAL | Status: DC
  Administered 2021-04-09 – 2021-04-11 (×3): 1 via ORAL
  Filled 2021-04-09 (×3): qty 1

## 2021-04-09 MED ORDER — EPHEDRINE 5 MG/ML INJ
5.0000 mg | INTRAVENOUS | Status: DC | PRN
  Administered 2021-04-09 (×3): 5 mg via INTRAVENOUS
  Filled 2021-04-09: qty 1

## 2021-04-09 MED ORDER — IBUPROFEN 600 MG PO TABS
600.0000 mg | ORAL_TABLET | Freq: Four times a day (QID) | ORAL | Status: DC
  Administered 2021-04-10 – 2021-04-11 (×4): 600 mg via ORAL
  Filled 2021-04-09 (×4): qty 1

## 2021-04-09 MED ORDER — OXYCODONE HCL 5 MG PO TABS: 10.0000 mg | ORAL_TABLET | ORAL | Status: DC | PRN

## 2021-04-09 MED ORDER — TETANUS-DIPHTH-ACELL PERTUSSIS 5-2.5-18.5 LF-MCG/0.5 IM SUSY: 0.5000 mL | PREFILLED_SYRINGE | Freq: Once | INTRAMUSCULAR | Status: DC

## 2021-04-09 MED ORDER — BUPIVACAINE 0.25 % ON-Q PUMP DUAL CATH 400 ML
400.0000 mL | INJECTION | Status: DC
  Filled 2021-04-09 (×2): qty 400

## 2021-04-09 MED ORDER — MORPHINE SULFATE (PF) 0.5 MG/ML IJ SOLN
INTRAMUSCULAR | Status: DC | PRN
  Administered 2021-04-09: .1 mg via EPIDURAL

## 2021-04-09 MED ORDER — SCOPOLAMINE 1 MG/3DAYS TD PT72: 1.0000 | MEDICATED_PATCH | Freq: Once | TRANSDERMAL | Status: DC

## 2021-04-09 MED ORDER — NALOXONE HCL 4 MG/10ML IJ SOLN
1.0000 ug/kg/h | INTRAVENOUS | Status: DC | PRN
  Filled 2021-04-09: qty 5

## 2021-04-09 MED ORDER — ACETAMINOPHEN 325 MG PO TABS: 650.0000 mg | ORAL_TABLET | ORAL | Status: DC | PRN

## 2021-04-09 MED ORDER — FAMOTIDINE 20 MG PO TABS
20.0000 mg | ORAL_TABLET | Freq: Once | ORAL | Status: AC
  Administered 2021-04-09: 20 mg via ORAL
  Filled 2021-04-09: qty 1

## 2021-04-09 MED ORDER — SIMETHICONE 80 MG PO CHEW
80.0000 mg | CHEWABLE_TABLET | ORAL | Status: DC | PRN
  Administered 2021-04-10: 80 mg via ORAL
  Filled 2021-04-09: qty 1

## 2021-04-09 MED ORDER — ORAL CARE MOUTH RINSE: 15.0000 mL | Freq: Once | OROMUCOSAL | Status: AC

## 2021-04-09 MED ORDER — SODIUM CHLORIDE 0.9 % IV SOLN
INTRAVENOUS | Status: DC | PRN
  Administered 2021-04-09: 40 ug/min via INTRAVENOUS

## 2021-04-09 MED ORDER — BUPIVACAINE ON-Q PAIN PUMP (FOR ORDER SET NO CHG): INJECTION | Status: DC

## 2021-04-09 MED ORDER — OXYTOCIN-SODIUM CHLORIDE 30-0.9 UT/500ML-% IV SOLN
INTRAVENOUS | Status: DC | PRN
  Administered 2021-04-09: 600 mL/h via INTRAVENOUS

## 2021-04-09 MED ORDER — SIMETHICONE 80 MG PO CHEW
80.0000 mg | CHEWABLE_TABLET | Freq: Three times a day (TID) | ORAL | Status: DC
  Administered 2021-04-09 – 2021-04-11 (×6): 80 mg via ORAL
  Filled 2021-04-09 (×6): qty 1

## 2021-04-09 MED ORDER — DIBUCAINE (PERIANAL) 1 % EX OINT: 1.0000 "application " | TOPICAL_OINTMENT | CUTANEOUS | Status: DC | PRN

## 2021-04-09 MED ORDER — LACTATED RINGERS IV SOLN: Freq: Once | INTRAVENOUS | Status: AC

## 2021-04-09 MED ORDER — CARBOPROST TROMETHAMINE 250 MCG/ML IM SOLN
INTRAMUSCULAR | Status: AC
  Filled 2021-04-09: qty 1

## 2021-04-09 MED ORDER — POVIDONE-IODINE 10 % EX SWAB: 2.0000 "application " | Freq: Once | CUTANEOUS | Status: DC

## 2021-04-09 MED ORDER — ONDANSETRON HCL 4 MG/2ML IJ SOLN: 4.0000 mg | Freq: Three times a day (TID) | INTRAMUSCULAR | Status: DC | PRN

## 2021-04-09 MED ORDER — MENTHOL 3 MG MT LOZG
1.0000 | LOZENGE | OROMUCOSAL | Status: DC | PRN
  Administered 2021-04-11: 3 mg via ORAL
  Filled 2021-04-09 (×2): qty 9

## 2021-04-09 MED ORDER — OXYTOCIN-SODIUM CHLORIDE 30-0.9 UT/500ML-% IV SOLN: 2.5000 [IU]/h | INTRAVENOUS | Status: AC

## 2021-04-09 MED ORDER — ONDANSETRON HCL 4 MG/2ML IJ SOLN
INTRAMUSCULAR | Status: DC | PRN
  Administered 2021-04-09: 4 mg via INTRAVENOUS

## 2021-04-09 MED ORDER — SENNOSIDES-DOCUSATE SODIUM 8.6-50 MG PO TABS
2.0000 | ORAL_TABLET | ORAL | Status: DC
  Administered 2021-04-09 – 2021-04-11 (×3): 2 via ORAL
  Filled 2021-04-09 (×3): qty 2

## 2021-04-09 MED ORDER — KETOROLAC TROMETHAMINE 30 MG/ML IJ SOLN
INTRAMUSCULAR | Status: DC | PRN
  Administered 2021-04-09: 30 mg via INTRAVENOUS

## 2021-04-09 MED ORDER — MEPERIDINE HCL 25 MG/ML IJ SOLN: 6.2500 mg | INTRAMUSCULAR | Status: DC | PRN

## 2021-04-09 MED ORDER — BUPIVACAINE IN DEXTROSE 0.75-8.25 % IT SOLN
INTRATHECAL | Status: DC | PRN
  Administered 2021-04-09: 2 mL via INTRATHECAL

## 2021-04-09 MED ORDER — CHLORHEXIDINE GLUCONATE 0.12 % MT SOLN
15.0000 mL | Freq: Once | OROMUCOSAL | Status: AC
  Administered 2021-04-09: 15 mL via OROMUCOSAL
  Filled 2021-04-09: qty 15

## 2021-04-09 SURGICAL SUPPLY — 29 items
ADH SKN CLS APL DERMABOND .7 (GAUZE/BANDAGES/DRESSINGS) ×2
APL PRP STRL LF DISP 70% ISPRP (MISCELLANEOUS) ×2
CATH KIT ON-Q SILVERSOAK 5IN (CATHETERS) ×6 IMPLANT
CHLORAPREP W/TINT 26 (MISCELLANEOUS) ×6 IMPLANT
CLOSURE STERI STRIP 1/2 X4 (GAUZE/BANDAGES/DRESSINGS) ×3 IMPLANT
COVER WAND RF STERILE (DRAPES) ×3 IMPLANT
DERMABOND ADVANCED (GAUZE/BANDAGES/DRESSINGS) ×4
DERMABOND ADVANCED .7 DNX12 (GAUZE/BANDAGES/DRESSINGS) ×2 IMPLANT
DRSG OPSITE POSTOP 4X6 (GAUZE/BANDAGES/DRESSINGS) ×3 IMPLANT
DRSG TEGADERM 4X4.75 (GAUZE/BANDAGES/DRESSINGS) ×3 IMPLANT
ELECT CAUTERY BLADE 6.4 (BLADE) ×3 IMPLANT
ELECT REM PT RETURN 9FT ADLT (ELECTROSURGICAL) ×3
ELECTRODE REM PT RTRN 9FT ADLT (ELECTROSURGICAL) ×1 IMPLANT
GLOVE SURG NEOPR MICRO LF SZ8 (GLOVE) ×3 IMPLANT
GOWN STRL REUS W/ TWL LRG LVL3 (GOWN DISPOSABLE) ×1 IMPLANT
GOWN STRL REUS W/ TWL XL LVL3 (GOWN DISPOSABLE) ×2 IMPLANT
GOWN STRL REUS W/TWL LRG LVL3 (GOWN DISPOSABLE) ×3
GOWN STRL REUS W/TWL XL LVL3 (GOWN DISPOSABLE) ×6
MANIFOLD NEPTUNE II (INSTRUMENTS) ×3 IMPLANT
MAT PREVALON FULL STRYKER (MISCELLANEOUS) ×3 IMPLANT
NS IRRIG 1000ML POUR BTL (IV SOLUTION) ×3 IMPLANT
PACK C SECTION AR (MISCELLANEOUS) ×3 IMPLANT
PAD OB MATERNITY 4.3X12.25 (PERSONAL CARE ITEMS) ×3 IMPLANT
PAD PREP 24X41 OB/GYN DISP (PERSONAL CARE ITEMS) ×3 IMPLANT
PENCIL SMOKE EVACUATOR (MISCELLANEOUS) ×3 IMPLANT
SUT MAXON ABS #0 GS21 30IN (SUTURE) ×6 IMPLANT
SUT VIC AB 1 CT1 36 (SUTURE) ×9 IMPLANT
SUT VIC AB 2-0 CT1 36 (SUTURE) ×3 IMPLANT
SUT VIC AB 4-0 FS2 27 (SUTURE) ×3 IMPLANT

## 2021-04-09 NOTE — Anesthesia Preprocedure Evaluation (Signed)
Anesthesia Evaluation  Patient identified by MRN, date of birth, ID band Patient awake  General Assessment Comment:Cocaine+. Does not appear acutely intoxicated  Quaternary c/section.   Reviewed: Allergy & Precautions, NPO status , Patient's Chart, lab work & pertinent test results  History of Anesthesia Complications Negative for: history of anesthetic complications  Airway Mallampati: II  TM Distance: >3 FB Neck ROM: Full   Comment: Tongue ring in place, not easily removable Dental  (+) Poor Dentition, Chipped   Pulmonary neg sleep apnea, neg COPD, Current SmokerPatient did not abstain from smoking.,    Pulmonary exam normal breath sounds clear to auscultation       Cardiovascular Exercise Tolerance: Good METS(-) hypertension(-) CAD and (-) Past MI negative cardio ROS  (-) dysrhythmias  Rhythm:Regular Rate:Normal - Systolic murmurs    Neuro/Psych negative neurological ROS  negative psych ROS   GI/Hepatic neg GERD  ,(+)     substance abuse  cocaine use and marijuana use, Patient cocaine positive today, but denies any recent use.   Endo/Other  neg diabetes  Renal/GU negative Renal ROS     Musculoskeletal   Abdominal   Peds  Hematology   Anesthesia Other Findings Past Medical History: No date: COVID-19 No date: Heart murmur     Comment:  since birth  Reproductive/Obstetrics                             Anesthesia Physical Anesthesia Plan  ASA: 3  Anesthesia Plan: Spinal   Post-op Pain Management:    Induction:   PONV Risk Score and Plan: 3 and Ondansetron, Dexamethasone and Treatment may vary due to age or medical condition  Airway Management Planned: Natural Airway  Additional Equipment:   Intra-op Plan:   Post-operative Plan:   Informed Consent: I have reviewed the patients History and Physical, chart, labs and discussed the procedure including the risks, benefits  and alternatives for the proposed anesthesia with the patient or authorized representative who has indicated his/her understanding and acceptance.       Plan Discussed with: CRNA and Surgeon  Anesthesia Plan Comments: (Discussed R/B/A of neuraxial anesthesia technique with patient: - rare risks of spinal/epidural hematoma, nerve damage, infection - Risk of PDPH - Risk of itching - Risk of nausea and vomiting - Risk of conversion to general anesthesia and its associated risks, including sore throat, damage to lips/teeth/oropharynx, and rare risks such as cardiac and respiratory events. - Risk of surgical bleeding requiring blood products - Increased risk of above two complications due to quaternary c-section and cocaine+ urine.  Patient counseled on benefits of smoking cessation, and increased perioperative risks associated with continued smoking.  Patient voiced understanding.)        Anesthesia Quick Evaluation

## 2021-04-09 NOTE — TOC Initial Note (Addendum)
Transition of Care [facility]) - Initial/Assessment Note    Patient Details  Name: Gabriella Pennington MRN: [id number] Date of Birth: August 12, 1986  [facility] CM/SW Contact:    Hetty Ely, RN Phone Number: 04/09/2021, 3:19 PM  Clinical Narrative:   Spoke with patient about baby needs, she reports that she has everything car seat, Crib, pampers, clothes, wipes and family support. This is baby #4, however she has custody of only 77, 33 year old son. Patient says she and the father are cordial, do not live together, however "iffy". Discussed post partum depression, patient denies having symptoms in the past, advised to keep PP appointment and notify PCP if she develops any symptoms. Patient says she is already signed up for [facility] services, will contact case worker to inform her of delivery.  Patient delivered via C-Section today, with UDS positive for Cocaine and Marijuana. Patient admits to smoking marijuana since age 31, however denies using Cocaine, says it's in the family and she may have been exposed. I did inform patient that I was required to report to CPS and she will need to answer the phone to discuss drug history and any other questions they may have. Phone number confirmed [phone].   CPS, hotline report was made per protocol.    Alex from [facility] returned call asked for Mother's phone number, which was given. Alex also asked about the baby's drug report and I informed her that it was not available at this time. Alex asked to be called when results are in [phone].               Patient Goals and CMS Choice        Expected Discharge Plan and Services                                                Prior Living Arrangements/Services                       Activities of Daily Living Home Assistive Devices/Equipment: None ADL Screening (condition at time of admission) Patient's cognitive ability adequate to safely complete daily activities?:  Yes Is the patient deaf or have difficulty hearing?: No Does the patient have difficulty seeing, even when wearing glasses/contacts?: No Does the patient have difficulty concentrating, remembering, or making decisions?: No Patient able to express need for assistance with ADLs?: Yes Does the patient have difficulty dressing or bathing?: No Independently performs ADLs?: Yes (appropriate for developmental age) Does the patient have difficulty walking or climbing stairs?: No Weakness of Legs: None Weakness of Arms/Hands: Right  Permission Sought/Granted                  Emotional Assessment              Admission diagnosis:  History of cesarean section [W29.562] Patient Active Problem List   Diagnosis Date Noted   History of cesarean section 04/09/2021   [redacted] weeks gestation of pregnancy 04/09/2021   Substance dependence (HCC) 04/09/2021   History of 3 cesarean sections 12/11/2020   Supervision of high risk pregnancy in first trimester 10/07/2020   Obesity affecting pregnancy in first trimester 10/07/2020   PCP:  Pcp, No Pharmacy:   [id number] [place], [place] - [address] [place] [address] Phone: [phone]  Fax: [phone]     Social Determinants of Health (SDOH) Interventions    Readmission Risk Interventions No flowsheet data found.

## 2021-04-09 NOTE — Op Note (Signed)
Cesarean Section Procedure Note Indications: prior cesarean section and term intrauterine pregnancy  Pre-operative Diagnosis: Intrauterine pregnancy [redacted]w[redacted]d ;  prior cesarean section and term intrauterine pregnancy Post-operative Diagnosis: same, delivered. Procedure: Low Transverse Cesarean Section Surgeon: Annamarie Major, MD, FACOG Assistant(s): Dr Jerene Pitch, No other capable assistant available, in surgery requiring high level assistant. Anesthesia: Spinal anesthesia Estimated Blood Loss:200  Complications: None; patient tolerated the procedure well. Disposition: PACU - hemodynamically stable. Condition: stable  Findings: A female infant in the cephalic presentation. "[place]" Amniotic fluid - Clear  Birth weight 7-3 lbs.  Apgars of 8 and 9.  Intact placenta with a three-vessel cord. Grossly normal uterus, tubes and ovaries bilaterally. Thin lower uterine segment. Minimal intraabdominal adhesions were noted.  Procedure Details   The patient was taken to Operating Room, identified as the correct patient and the procedure verified as C-Section Delivery. A Time Out was held and the above information confirmed. After induction of anesthesia, the patient was draped and prepped in the usual sterile manner. A Pfannenstiel incision was made and carried down through the subcutaneous tissue to the fascia. Fascial incision was made and extended transversely with the Mayo scissors. The fascia was separated from the underlying rectus tissue superiorly and inferiorly. The peritoneum was identified and entered bluntly. Peritoneal incision was extended longitudinally. The utero-vesical peritoneal reflection was incised transversely and a bladder flap was created digitally.  A low transverse hysterotomy was made. The fetus was delivered atraumatically. The umbilical cord was clamped x2 and cut and the infant was handed to the awaiting pediatricians. The placenta was removed intact and appeared normal with a  3-vessel cord.  The uterus was exteriorized and cleared of all clot and debris. The hysterotomy was closed with running sutures of 0 Vicryl suture. A second imbricating layer was placed with the same suture. Excellent hemostasis was observed. The uterus was returned to the abdomen. The pelvis was irrigated and again, excellent hemostasis was noted.  The On Q Pain pump System was then placed.  Trocars were placed through the abdominal wall into the subfascial space and these were used to thread the silver soaker cathaters into place.The rectus fascia was then reapproximated with running sutures of Maxon, with careful placement not to incorporate the cathaters. Subcutaneous tissues are then irrigated with saline and hemostasis assured.  Skin is then closed with 4-0 vicryl suture in a subcuticular fashion followed by skin adhesive. The cathaters are flushed each with 5 mL of Bupivicaine and stabilized into place with dressing. The surgical assistant performed tissue retraction, assistance with suturing, and fundal pressure.   Instrument, sponge, and needle counts were correct prior to the abdominal closure and at the conclusion of the case.  The patient tolerated the procedure well and was transferred to the recovery room in stable condition.   Annamarie Major, MD, [facility], [facility] 04/09/2021  8:44 AM

## 2021-04-09 NOTE — Interval H&P Note (Signed)
History and Physical Interval Note:  04/09/2021 7:02 AM  Gabriella Pennington  has presented today for surgery, with the diagnosis of Prior Cesarean, Sterility.  The various methods of treatment have been discussed with the patient and family. After consideration of risks, benefits and other options for treatment, the patient has consented to  Procedure(s): CESAREAN SECTION WITH BILATERAL TUBAL LIGATION (Bilateral) as a surgical intervention.  The patient's history has been reviewed, patient examined, no change in status, stable for surgery.  I have reviewed the patient's chart and labs.  Questions were answered to the patient's satisfaction.     Letitia Libra

## 2021-04-09 NOTE — Transfer of Care (Signed)
Immediate Anesthesia Transfer of Care Note  Patient: Gabriella Pennington  Procedure(s) Performed: Repeat Cesarean Section (Bilateral)  Patient Location: PACU and Mother/Baby  Anesthesia Type:Spinal  Level of Consciousness: awake, alert  and oriented  Airway & Oxygen Therapy: Patient Spontanous Breathing  Post-op Assessment: Report given to RN and Post -op Vital signs reviewed and stable  Post vital signs: Reviewed and stable  Last Vitals:  Vitals Value Taken Time  BP 87/47 04/09/21 0854  Temp    Pulse 52 04/09/21 0855  Resp 13 04/09/21 0855  SpO2 92 % 04/09/21 0855  Vitals shown include unvalidated device data.  Last Pain:  Vitals:   04/09/21 0845  TempSrc:   PainSc: 0-No pain      Patients Stated Pain Goal: 0 (04/09/21 3500)  Complications: No notable events documented.

## 2021-04-10 LAB — CBC
HCT: 35 % — ABNORMAL LOW (ref 36.0–46.0)
Hemoglobin: 11.8 g/dL — ABNORMAL LOW (ref 12.0–15.0)
MCH: 30.3 pg (ref 26.0–34.0)
MCHC: 33.7 g/dL (ref 30.0–36.0)
MCV: 90 fL (ref 80.0–100.0)
Platelets: 166 10*3/uL (ref 150–400)
RBC: 3.89 MIL/uL (ref 3.87–5.11)
RDW: 14.3 % (ref 11.5–15.5)
WBC: 11.6 10*3/uL — ABNORMAL HIGH (ref 4.0–10.5)
nRBC: 0 % (ref 0.0–0.2)

## 2021-04-10 NOTE — TOC Progression Note (Signed)
Transition of Care [facility]) - Progression Note    Patient Details  Name: Gabriella Pennington MRN: [id number] Date of Birth: [birth year]  Transition of Care [facility]) CM/SW Contact  Belington Cellar, RN Phone Number: 04/10/2021, 4:03 PM  Clinical Narrative:    Sherron Monday to Trinna Post @ [place] CPS-previous CPS report had been screened out and was not being follow up.  Writer completed new report with new information regarding concerns for false negative with UDS on infant and concerns related to MOB not being compliant with staff on unit and multiple events of reeducating on infant safety.         Expected Discharge Plan and Services                                                 Social Determinants of Health (SDOH) Interventions    Readmission Risk Interventions No flowsheet data found.

## 2021-04-10 NOTE — Progress Notes (Signed)
Subjective: Postpartum Day 1: Cesarean Delivery Patient reports tolerating PO, + flatus, and no problems voiding.  She is using a heating pad for some back pain.she is formula feeding her baby.   Objective: Vital signs in last 24 hours: Temp:  [97.5 F (36.4 C)-98.1 F (36.7 C)] 98.1 F (36.7 C) (06/10 0811) Pulse Rate:  [51-90] 66 (06/10 0811) Resp:  [15-18] 18 (06/10 0811) BP: (93-130)/(53-89) 130/89 (06/10 0811) SpO2:  [94 %-99 %] 97 % (06/10 0811)  Her UDS on admission shows + cocaine and marijuana  Physical Exam:  General: cooperative, no distress, and mildly obese Lochia: appropriate Uterine Fundus: firm Incision: healing well, no significant drainage DVT Evaluation: No evidence of DVT seen on physical exam. Negative Homan's sign.  Recent Labs    04/08/21 0935 04/10/21 0407  HGB 12.9 11.8*  HCT 38.6 35.0*    Assessment/Plan: Status post Cesarean section. Doing well postoperatively.  Continue current care Needs CPS referral due to her + cocaine result. Social work /Transition to care referral has already been made. Mirna Mires 04/10/2021, 10:50 AM

## 2021-04-10 NOTE — Progress Notes (Signed)
RN entered room with mother in bathroom and baby unattended in patient's bed with loose sheets around baby. RN educated patient again about putting baby in bassinet with no loose covers and/or pillows around him when moving around the room and going to the bathroom. Patient states understanding.
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
## 2021-04-10 NOTE — Anesthesia Postprocedure Evaluation (Signed)
Anesthesia Post Note  Patient: Gabriella Pennington  Procedure(s) Performed: Repeat Cesarean Section (Bilateral)  Patient location during evaluation: Mother Baby Anesthesia Type: Spinal Level of consciousness: awake, oriented and awake and alert Pain management: pain level controlled Vital Signs Assessment: post-procedure vital signs reviewed and stable Respiratory status: respiratory function stable and nonlabored ventilation Cardiovascular status: blood pressure returned to baseline and stable Postop Assessment: no headache and no backache Anesthetic complications: no   No notable events documented.   Last Vitals:  Vitals:   04/10/21 0323 04/10/21 0811  BP: 128/74 130/89  Pulse: 63 66  Resp: 15 18  Temp: 36.6 C 36.7 C  SpO2: 97% 97%    Last Pain:  Vitals:   04/10/21 0908  TempSrc:   PainSc: 0-No pain                 Ginger Carne

## 2021-04-10 NOTE — TOC Progression Note (Addendum)
Transition of Care [facility]) - Progression Note    Patient Details  Name: Gabriella Pennington MRN: [id number] Date of Birth: 06-04-1986  Transition of Care [facility]) CM/SW Contact  Cusseta Cellar, RN Phone Number: 04/10/2021, 9:58 AM  Clinical Narrative:    Confirmed infant UDS (-). Concerns regarding mother removing the urine bag multiple times while waiting for specimen. Patient demanding to leave the unit. Staff updated patient that she could not leave unit at this time however patient refused and left unit. Will update CPS  Child psychotherapist.   LVMM for [place] CPS for specific worker following case.         Expected Discharge Plan and Services                                                 Social Determinants of Health (SDOH) Interventions    Readmission Risk Interventions No flowsheet data found.

## 2021-04-10 NOTE — Progress Notes (Signed)
Patient stated wanting to leave the unit and RN told patient that she could not leave the unit with an IV in her arm and patient refused to return to unit despite IV still being in place. Rn told Midwife and Public relations account executive.

## 2021-04-11 ENCOUNTER — Encounter: Payer: Self-pay | Admitting: Obstetrics and Gynecology

## 2021-04-11 MED ORDER — OXYCODONE-ACETAMINOPHEN 5-325 MG PO TABS: 1.0000 | ORAL_TABLET | ORAL | 0 refills | Status: DC | PRN

## 2021-04-11 NOTE — Progress Notes (Signed)
Reviewed AVS and discharge teaching, Pt verbalized understandin. Answered questions checked ID bracelets and Discharged home.

## 2021-04-11 NOTE — Discharge Summary (Signed)
Postpartum Discharge Summary    Patient Name: Gabriella Pennington DOB: 1986-05-16 MRN: [id number]  Date of admission: 04/09/2021 Delivery date:04/09/2021  Delivering provider: Gae Dry  Date of discharge: 04/11/2021  Admitting diagnosis: History of cesarean section [Z98.891] Intrauterine pregnancy: [redacted]w[redacted]d    Secondary diagnosis:  Principal Problem:   History of 3 cesarean sections Active Problems:   [redacted] weeks gestation of pregnancy   Substance dependence ([place]  Additional problems: Pt has a postieve urine drug screen for cocaine and MJ on admission.    Discharge diagnosis: Term Pregnancy Delivered                                              Post partum procedures: none Augmentation: N/A Complications: None  Hospital course: Sceduled C/S   35y.o. yo [id number] admitted to the hospital 04/09/2021 for scheduled cesarean section with the following indication:Elective Repeat.Delivery details are as follows:  Membrane Rupture Time/Date: 7:58 AM ,04/09/2021   Delivery Method:C-Section, Low Transverse  Details of operation can be found in separate operative note.  Patient had an uncomplicated postpartum course.  She is ambulating, tolerating a regular diet, passing flatus, and urinating well. Patient is discharged home in stable condition on  04/11/21        Newborn Data: Birth date:04/09/2021  Birth time:7:59 AM  Gender:Female  Living status:Living  Apgars:8 ,9  Weight:3270 g     Magnesium Sulfate received: No BMZ received: No Rhophylac:No MMR:No T-DaP: declined Flu: No Transfusion:No  Physical exam  Vitals:   04/10/21 1624 04/10/21 2041 04/10/21 2332 04/11/21 0753  BP: 126/60 127/74 (!) 123/108 135/88  Pulse: 62 62 76 78  Resp: _0 Temp: 98 F (36.7 C) 98 F (36.7 C) 98 F (36.7 C) 98.2 F (36.8 C)  TempSrc: Oral Oral Oral Oral  SpO2:  96% 99% 95%  Weight:      Height:       General: alert, cooperative, and no distress Lochia:  appropriate Uterine Fundus: firm Incision: Healing well with no significant drainage, No significant erythema DVT Evaluation: No evidence of DVT seen on physical exam. Negative Homan's sign. Labs: Lab Results  Component Value Date   WBC 11.6 (H) 04/10/2021   HGB 11.8 (L) 04/10/2021   HCT 35.0 (L) 04/10/2021   MCV 90.0 04/10/2021   PLT 166 04/10/2021   No flowsheet data found. Edinburgh Score: No flowsheet data found.    After visit meds:  Allergies as of 04/11/2021       Reactions   Penicillins Hives        Medication List     STOP taking these medications    acetaminophen 500 MG tablet Commonly known as: TYLENOL   calcium carbonate 750 MG chewable tablet Commonly known as: TUMS EX       TAKE these medications    oxyCODONE-acetaminophen 5-325 MG tablet Commonly known as: PERCOCET/ROXICET Take 1 tablet by mouth every 4 (four) hours as needed for moderate pain.   prenatal multivitamin Tabs tablet Take 1 tablet by mouth daily.               Discharge Care Instructions  (From admission, onward)           Start     Ordered   04/11/21 0000  If the dressing is still  on your incision site when you go home, remove it on the third day after your surgery date. Remove dressing if it begins to fall off, or if it is dirty or damaged before the third day.        04/11/21 0957             Discharge home in stable condition Infant Feeding: Bottle Infant Disposition:home with mother Discharge instruction: per After Visit Summary and Postpartum booklet. Activity: Advance as tolerated. Pelvic rest for 6 weeks.  Diet: routine diet Anticipated Birth Control: Depo Postpartum Appointment:6 weeks Additional Postpartum F/U: Incision check 1 week (Jun 15)  Future Appointments: Future Appointments  Date Time Provider Department Center  04/15/2021  3:10 PM Harris, Robert P, MD [facility] None     04/11/2021 Robert Paul Harris, MD   

## 2021-04-11 NOTE — TOC Progression Note (Addendum)
Transition of Care [facility]) - Progression Note    Patient Details  Name: AUBREYANA SALTZ MRN: [id number] Date of Birth: Jul 26, 1986  [facility]) CM/SW Contact  Gildardo Griffes, [place] Phone Number: 04/11/2021, 9:22 AM  Clinical Narrative:     CSW spoke with CPS on call social worker, reports yesterday's new report case was accepted as of 3:50 pm yesterday 6/10 and they have 24 hours to initiate contact.      Supervisor Dagoberto Reef aware of above, and informed of discharge today. CPS will follow up with patient/child following discharge as there are no grounds to hold patient here at this time.   Expected Discharge Plan and Services                                                 Social Determinants of Health (SDOH) Interventions    Readmission Risk Interventions No flowsheet data found.

## 2021-04-15 ENCOUNTER — Other Ambulatory Visit: Payer: Self-pay

## 2021-04-15 ENCOUNTER — Encounter: Payer: Self-pay | Admitting: Obstetrics & Gynecology

## 2021-04-15 ENCOUNTER — Ambulatory Visit (INDEPENDENT_AMBULATORY_CARE_PROVIDER_SITE_OTHER): Payer: Medicaid Other | Admitting: Obstetrics & Gynecology

## 2021-04-15 VITALS — BP 100/60 | Ht 63.0 in | Wt 180.0 lb

## 2021-04-15 DIAGNOSIS — Z98891 History of uterine scar from previous surgery: Secondary | ICD-10-CM

## 2021-04-15 NOTE — Progress Notes (Signed)
  [name] presents post op from recent Cesarean Section performed for Elective repeat, 1 week ago.   Subjective: Patient reports marked improvement in her immediate post op symptoms. Eating a regular diet without difficulty. The patient is not having any pain.  Activity: normal activities of daily living. Patient reports additional symptom's since surgery of appropriate lochia, no signs of depression, and no signs of mastitis.  Objective: BP 100/60   Ht 5\' 3"  (1.6 m)   Wt 180 lb (81.6 kg)   LMP 07/17/2020 (Within Weeks) Comment: spotting x 4days  BMI 31.89 kg/m  Physical Exam Constitutional:      General: She is not in acute distress.    Appearance: She is well-developed.  Cardiovascular:     Rate and Rhythm: Normal rate.  Pulmonary:     Effort: Pulmonary effort is normal.  Abdominal:     General: There is no distension.     Palpations: Abdomen is soft.     Tenderness: There is no abdominal tenderness.     Comments: Incision Healing Well   Musculoskeletal:        General: Normal range of motion.  Neurological:     Mental Status: She is alert and oriented to person, place, and time.     Cranial Nerves: No cranial nerve deficit.  Skin:    General: Skin is warm and dry.    Assessment: s/p : Cesarean Section for Elective repeat stable  Plan: Patient has done well after her Cesarean Section with no apparent complications.  I have discussed the post-operative course to date, and the expected progress moving forward.  The patient understands what complications to be concerned about.  I will see the patient in routine follow up, or sooner if needed.    Activity plan: No heavy lifting.07/19/2020  Pelvic rest. She desires Depo-Provera for postpartum contraception.  (Received dose in hospital last week) No s/sx PPD  Marland Kitchen 04/15/2021, 3:42 PM

## 2021-05-21 ENCOUNTER — Encounter: Payer: Medicaid Other | Admitting: Obstetrics & Gynecology

## 2021-05-21 ENCOUNTER — Encounter: Payer: Self-pay | Admitting: Obstetrics & Gynecology

## 2021-05-21 ENCOUNTER — Other Ambulatory Visit: Payer: Self-pay

## 2021-05-21 ENCOUNTER — Ambulatory Visit (INDEPENDENT_AMBULATORY_CARE_PROVIDER_SITE_OTHER): Payer: Medicaid Other | Admitting: Obstetrics & Gynecology

## 2021-05-21 DIAGNOSIS — Z98891 History of uterine scar from previous surgery: Secondary | ICD-10-CM

## 2021-05-21 NOTE — Progress Notes (Signed)
  OBSTETRICS POSTPARTUM CLINIC PROGRESS NOTE  Subjective:     Gabriella Pennington is a 35 y.o. [id number] female who presents for a postpartum visit. She is 6 weeks postpartum following a Term pregnancy or Pregnancy complicated by: prior CS x3 and substance abuse; and delivery by C-section.  I have fully reviewed the prenatal and intrapartum course. Anesthesia: spinal.  Postpartum course has been complicated by uncomplicated.  Baby is feeding by Bottle.  Bleeding: patient has not  resumed menses.  Bowel function is normal. Bladder function is normal.  Patient is sexually active. Contraception method desired is Depo-Provera injections.  Postpartum depression screening: negative. Edinburgh 1.  The following portions of the patient's history were reviewed and updated as appropriate: allergies, current medications, past family history, past medical history, past social history, past surgical history, and problem list.  Review of Systems Pertinent items are noted in HPI.  Objective:    BP 110/70   Ht 5\' 3"  (1.6 m)   Wt 183 lb (83 kg)   LMP 07/17/2020 (Within Weeks) Comment: spotting x 4days  Breastfeeding No   BMI 32.42 kg/m   General:  alert and no distress   Breasts:  inspection negative, no nipple discharge or bleeding, no masses or nodularity palpable  Lungs: clear to auscultation bilaterally  Heart:  regular rate and rhythm, S1, S2 normal, no murmur, click, rub or gallop  Abdomen: soft, non-tender; bowel sounds normal; no masses,  no organomegaly.  Well healed Pfannenstiel incision   Vulva:  normal  Vagina: normal vagina, no discharge, exudate, lesion, or erythema  Cervix:  no cervical motion tenderness and no lesions  Corpus: normal size, contour, position, consistency, mobility, non-tender  Adnexa:  normal adnexa and no mass, fullness, tenderness  Rectal Exam: Not performed.          Assessment:  Post Partum Care visit 1. Postpartum care and examination  2. S/P cesarean  section Wait at least 2 years before trying for another pregnancy  Plan:  See orders and Patient Instructions Advised to stop smoking Contraceptive counseling for Depo-Provera Follow up in: 6 weeks or as needed.   07/19/2020, MD, Annamarie Major Ob/Gyn, [facility] 05/21/2021  2:24 PM

## 2021-07-02 ENCOUNTER — Ambulatory Visit: Payer: Medicaid Other

## 2021-07-02 ENCOUNTER — Telehealth: Payer: Self-pay

## 2021-07-02 ENCOUNTER — Other Ambulatory Visit: Payer: Self-pay | Admitting: Obstetrics and Gynecology

## 2021-07-02 DIAGNOSIS — Z3042 Encounter for surveillance of injectable contraceptive: Secondary | ICD-10-CM

## 2021-07-02 MED ORDER — MEDROXYPROGESTERONE ACETATE 150 MG/ML IM SUSP: 150.0000 mg | INTRAMUSCULAR | 3 refills | Status: DC

## 2021-07-02 NOTE — Telephone Encounter (Signed)
Order placed

## 2021-07-02 NOTE — Telephone Encounter (Signed)
Pt calling for refill on depo;  was given depo in hosp.; last day of window is 07/04/21; had appt for inj today but has resch to tomorrow.  [phone]

## 2021-07-03 ENCOUNTER — Ambulatory Visit (INDEPENDENT_AMBULATORY_CARE_PROVIDER_SITE_OTHER): Payer: Medicaid Other

## 2021-07-03 ENCOUNTER — Other Ambulatory Visit: Payer: Self-pay

## 2021-07-03 DIAGNOSIS — Z3042 Encounter for surveillance of injectable contraceptive: Secondary | ICD-10-CM

## 2021-07-03 MED ORDER — MEDROXYPROGESTERONE ACETATE 150 MG/ML IM SUSP
150.0000 mg | Freq: Once | INTRAMUSCULAR | Status: AC
  Administered 2021-07-03: 150 mg via INTRAMUSCULAR

## 2021-07-03 NOTE — Progress Notes (Signed)
Patient presents today for Depo Provera injection within dates. Given IM LD. Patient tolerated well.  

## 2021-07-03 NOTE — Telephone Encounter (Signed)
LMVM to notify pt. 

## 2021-09-28 ENCOUNTER — Ambulatory Visit: Payer: Medicaid Other

## 2021-11-16 ENCOUNTER — Ambulatory Visit: Payer: Medicaid Other | Admitting: Obstetrics & Gynecology

## 2022-09-02 ENCOUNTER — Ambulatory Visit (INDEPENDENT_AMBULATORY_CARE_PROVIDER_SITE_OTHER): Payer: Medicaid Other

## 2022-09-02 ENCOUNTER — Other Ambulatory Visit (HOSPITAL_COMMUNITY)
Admission: RE | Admit: 2022-09-02 | Discharge: 2022-09-02 | Disposition: A | Discharge status: Home / Self Care | Payer: Medicaid Other | Source: Ambulatory Visit | Attending: Licensed Practical Nurse | Admitting: Licensed Practical Nurse

## 2022-09-02 VITALS — BP 106/74 | HR 73 | Ht 63.0 in | Wt 205.0 lb

## 2022-09-02 DIAGNOSIS — N898 Other specified noninflammatory disorders of vagina: Secondary | ICD-10-CM | POA: Diagnosis present

## 2022-09-02 NOTE — Progress Notes (Signed)
   Nurse Visit  Subjective   Patient ID: Gabriella Pennington, female    DOB: 1986/09/01  Age: 36 y.o. MRN: [id number]  Chief Complaint  Patient presents with   Vaginal Discharge       complains of foul, green, and thick vaginal discharge for 2 week(s). Denies abnormal vaginal bleeding or significant pelvic pain or fever. No UTI symptoms. Denies history of known exposure to STD.  Patient's last menstrual period was 08/28/2022 (exact date).    Objective:     Ht 5\' 3"  (1.6 m)   Wt 205 lb (93 kg)   LMP 08/28/2022 (Exact Date)   BMI 36.31 kg/m   She appears well, afebrile.    Assessment & Plan:   ASSESSMENT:  nonspecific vaginitis  PLAN:  GC and chlamydia DNA  probe sent to lab. Treatment: Will await swab results.  ROV prn if symptoms persist or worsen.     Otila Kluver, LPN

## 2022-09-06 ENCOUNTER — Other Ambulatory Visit: Payer: Self-pay | Admitting: Family

## 2022-09-06 LAB — CERVICOVAGINAL ANCILLARY ONLY
Bacterial Vaginitis (gardnerella): POSITIVE — AB
Candida Glabrata: NEGATIVE
Candida Vaginitis: NEGATIVE
Chlamydia: NEGATIVE
Comment: NEGATIVE
Comment: NEGATIVE
Comment: NEGATIVE
Comment: NEGATIVE
Comment: NEGATIVE
Comment: NORMAL
Neisseria Gonorrhea: NEGATIVE
Trichomonas: POSITIVE — AB

## 2022-09-06 MED ORDER — METRONIDAZOLE 250 MG PO TABS: 500.0000 mg | ORAL_TABLET | Freq: Two times a day (BID) | ORAL | Status: DC

## 2022-09-07 ENCOUNTER — Telehealth: Payer: Self-pay

## 2022-09-07 DIAGNOSIS — A599 Trichomoniasis, unspecified: Secondary | ICD-10-CM

## 2022-09-07 DIAGNOSIS — B9689 Other specified bacterial agents as the cause of diseases classified elsewhere: Secondary | ICD-10-CM

## 2022-09-07 NOTE — Telephone Encounter (Signed)
Pt calling; has seen her test results; when will her rx be called in?  [phone]  Pt aware rx has been sent to [facility].

## 2022-09-07 NOTE — Progress Notes (Signed)
This encounter was created in error - please disregard.

## 2022-09-07 NOTE — Addendum Note (Signed)
Addended by: Cleophas Dunker D on: 09/07/2022 04:01 PM   Modules accepted: Orders, Level of Service

## 2022-09-08 MED ORDER — METRONIDAZOLE 500 MG PO TABS
500.0000 mg | ORAL_TABLET | Freq: Two times a day (BID) | ORAL | 0 refills | Status: DC
Start: 2022-09-08 — End: 2023-09-23

## 2022-09-08 NOTE — Telephone Encounter (Signed)
Called Gabriella Pennington to let her know [facility] never did get [facility] prescription, I did resend it as it was an error on our end.   Told patient she needed to follow up in 4 weeks for a TOC.

## 2022-09-08 NOTE — Telephone Encounter (Signed)
Patient called triage stating that [facility] has never received [facility] medicine.

## 2022-09-08 NOTE — Addendum Note (Signed)
Addended by: Burtis Junes on: 09/08/2022 01:53 PM   Modules accepted: Orders

## 2022-09-14 ENCOUNTER — Ambulatory Visit: Payer: Medicaid Other

## 2022-10-06 ENCOUNTER — Ambulatory Visit (INDEPENDENT_AMBULATORY_CARE_PROVIDER_SITE_OTHER): Payer: Medicaid Other

## 2022-10-06 ENCOUNTER — Other Ambulatory Visit (HOSPITAL_COMMUNITY)
Admission: RE | Admit: 2022-10-06 | Discharge: 2022-10-06 | Disposition: A | Discharge status: Home / Self Care | Payer: Medicaid Other | Source: Ambulatory Visit | Attending: Obstetrics and Gynecology | Admitting: Obstetrics and Gynecology

## 2022-10-06 VITALS — BP 103/60 | HR 71 | Ht 63.0 in | Wt 222.0 lb

## 2022-10-06 DIAGNOSIS — Z8619 Personal history of other infectious and parasitic diseases: Secondary | ICD-10-CM
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
## 2022-10-07 LAB — CERVICOVAGINAL ANCILLARY ONLY
Chlamydia: NEGATIVE
Comment: NEGATIVE
Comment: NEGATIVE
Comment: NORMAL
Neisseria Gonorrhea: NEGATIVE
Trichomonas: NEGATIVE

## 2022-12-21 ENCOUNTER — Emergency Department (HOSPITAL_COMMUNITY)
Admission: EM | Admit: 2022-12-21 | Discharge: 2022-12-21 | Disposition: A | Discharge status: Home / Self Care | Payer: Medicaid Other | Attending: Emergency Medicine | Admitting: Emergency Medicine

## 2022-12-21 ENCOUNTER — Other Ambulatory Visit: Payer: Self-pay

## 2022-12-21 ENCOUNTER — Encounter (HOSPITAL_COMMUNITY): Payer: Self-pay | Admitting: Emergency Medicine

## 2022-12-21 DIAGNOSIS — H9202 Otalgia, left ear: Secondary | ICD-10-CM | POA: Diagnosis present

## 2022-12-21 DIAGNOSIS — H9312 Tinnitus, left ear: Secondary | ICD-10-CM | POA: Diagnosis not present

## 2022-12-21 NOTE — ED Triage Notes (Signed)
Pt c/o left ear pain and tinnitus for the past 4 days. Pt describes the pain as pressure. Recently dx with the flu. Pt states her mom gave her some kind of antibiotic but she is unsure what kind and stated it did not help.

## 2022-12-21 NOTE — Discharge Instructions (Signed)
Get help right away if: You develop tinnitus after a head injury. You have tinnitus along with any of the following: Dizziness. Nausea and vomiting. Loss of balance. Sudden, severe headache. Vision changes. Facial weakness or weakness of arms or legs.

## 2022-12-21 NOTE — ED Provider Notes (Signed)
[facility] Provider Note   CSN: [id number] Arrival date & time: 12/21/22  1815     History  Chief Complaint  Patient presents with   Otalgia    Gabriella Pennington is a 37 y.o. female.  Who was the emergency department with ringing in her ear.  She reports ringing in her left ear for the past 3 days.  Her mother gave her doxycycline which did not improve her symptoms.  She has no other symptoms.  She denies   Otalgia      Home Medications Prior to Admission medications   Medication Sig Start Date End Date Taking? Authorizing Provider  metroNIDAZOLE (FLAGYL) 500 MG tablet Take 1 tablet (500 mg total) by mouth 2 (two) times daily. Patient not taking: Reported on 10/06/2022 09/08/22   Maggie Font, CNM      Allergies    Penicillins    Review of Systems   Review of Systems  HENT:  Positive for ear pain.     Physical Exam Updated Vital Signs BP 129/79   Pulse 79   Temp 98.2 F (36.8 C)   Resp 20   Ht 5' 3"$  (1.6 m)   Wt 95.3 kg   SpO2 99%   BMI 37.20 kg/m  Physical Exam Vitals and nursing note reviewed.  Constitutional:      General: She is not in acute distress.    Appearance: She is well-developed. She is not diaphoretic.  HENT:     Head: Normocephalic and atraumatic.     Right Ear: Hearing, tympanic membrane, ear canal and external ear normal.     Left Ear: Hearing, tympanic membrane, ear canal and external ear normal.     Nose: Nose normal.     Mouth/Throat:     Mouth: Mucous membranes are moist.  Eyes:     General: No scleral icterus.    Conjunctiva/sclera: Conjunctivae normal.  Cardiovascular:     Rate and Rhythm: Normal rate and regular rhythm.     Heart sounds: Normal heart sounds. No murmur heard.    No friction rub. No gallop.  Pulmonary:     Effort: Pulmonary effort is normal. No respiratory distress.     Breath sounds: Normal breath sounds.  Abdominal:     General: Bowel sounds are normal. There is  no distension.     Palpations: Abdomen is soft. There is no mass.     Tenderness: There is no abdominal tenderness. There is no guarding.  Musculoskeletal:     Cervical back: Normal range of motion.  Skin:    General: Skin is warm and dry.  Neurological:     Mental Status: She is alert and oriented to person, place, and time.  Psychiatric:        Behavior: Behavior normal.     ED Results / Procedures / Treatments   Labs (all labs ordered are listed, but only abnormal results are displayed) Labs Reviewed - No data to display  EKG None  Radiology No results found.  Procedures Procedures    Medications Ordered in ED Medications - No data to display  ED Course/ Medical Decision Making/ A&P                             Medical Decision Making Patient here with tinnitus.  Denies aspirin use.  No evidence of acute otitis media or externa.  Patient will be discharged to  follow-up with ENT.  Discussed outpatient follow-up and return precautions           Final Clinical Impression(s) / ED Diagnoses Final diagnoses:  None    Rx / DC Orders ED Discharge Orders     None         Margarita Mail, PA-C 12/21/22 2126    Valarie Merino, MD 12/21/22 2241

## 2023-08-25 ENCOUNTER — Emergency Department
Admission: EM | Admit: 2023-08-25 | Discharge: 2023-08-25 | Disposition: A | Discharge status: Home / Self Care | Payer: Medicaid Other | Attending: Emergency Medicine | Admitting: Emergency Medicine

## 2023-08-25 ENCOUNTER — Emergency Department: Payer: Medicaid Other

## 2023-08-25 ENCOUNTER — Encounter: Payer: Self-pay | Admitting: Emergency Medicine

## 2023-08-25 ENCOUNTER — Other Ambulatory Visit: Payer: Self-pay

## 2023-08-25 DIAGNOSIS — R55 Syncope and collapse: Secondary | ICD-10-CM | POA: Insufficient documentation

## 2023-08-25 DIAGNOSIS — Z3A00 Weeks of gestation of pregnancy not specified: Secondary | ICD-10-CM | POA: Insufficient documentation

## 2023-08-25 DIAGNOSIS — O200 Threatened abortion: Secondary | ICD-10-CM | POA: Diagnosis not present

## 2023-08-25 DIAGNOSIS — R1011 Right upper quadrant pain: Secondary | ICD-10-CM

## 2023-08-25 DIAGNOSIS — O26899 Other specified pregnancy related conditions, unspecified trimester: Secondary | ICD-10-CM | POA: Diagnosis present

## 2023-08-25 LAB — CBC
HCT: 40.4 % (ref 36.0–46.0)
Hemoglobin: 13 g/dL (ref 12.0–15.0)
MCH: 29.1 pg (ref 26.0–34.0)
MCHC: 32.2 g/dL (ref 30.0–36.0)
MCV: 90.6 fL (ref 80.0–100.0)
Platelets: 242 10*3/uL (ref 150–400)
RBC: 4.46 MIL/uL (ref 3.87–5.11)
RDW: 12.6 % (ref 11.5–15.5)
WBC: 8.4 10*3/uL (ref 4.0–10.5)
nRBC: 0 % (ref 0.0–0.2)

## 2023-08-25 LAB — URINALYSIS, ROUTINE W REFLEX MICROSCOPIC
Bilirubin Urine: NEGATIVE
Glucose, UA: NEGATIVE mg/dL
Hgb urine dipstick: NEGATIVE
Ketones, ur: 5 mg/dL — AB
Leukocytes,Ua: NEGATIVE
Nitrite: NEGATIVE
Protein, ur: NEGATIVE mg/dL
Specific Gravity, Urine: 1.02 (ref 1.005–1.030)
pH: 6 (ref 5.0–8.0)

## 2023-08-25 LAB — BASIC METABOLIC PANEL
Anion gap: 8 (ref 5–15)
BUN: 8 mg/dL (ref 6–20)
CO2: 22 mmol/L (ref 22–32)
Calcium: 8.5 mg/dL — ABNORMAL LOW (ref 8.9–10.3)
Chloride: 105 mmol/L (ref 98–111)
Creatinine, Ser: 0.56 mg/dL (ref 0.44–1.00)
GFR, Estimated: >60 mL/min (ref >60)
Glucose, Bld: 81 mg/dL (ref 70–99)
Potassium: 3.5 mmol/L (ref 3.5–5.1)
Sodium: 135 mmol/L (ref 135–145)

## 2023-08-25 LAB — HCG, QUANTITATIVE, PREGNANCY: hCG, Beta Chain, Quant, S: 99306 m[IU]/mL — ABNORMAL HIGH (ref <5)

## 2023-08-25 LAB — PREGNANCY, URINE: Preg Test, Ur: POSITIVE — AB

## 2023-08-25 NOTE — ED Provider Notes (Signed)
[facility] Provider Note    Event Date/Time   First MD Initiated Contact with Patient 08/25/23 1939     (approximate)   History   Abdominal Pain and Weakness   HPI  Gabriella Pennington is a 37 y.o. female   Past medical history of no significant past medical history who presents to the emergency department with right upper quadrant pain today.  She has been feeling lightheaded over the last several days, developed right upper quadrant pain today, and lay down in the shower for pain relief and may have passed out briefly.  She continues with right upper quadrant pain without any nausea or vomiting.  She denies any urinary changes or changes with her bowel movements or GI bleeding.  Of note her last menstrual period was approximately 1 and half month ago and she had a home pregnancy test which was positive.  She has had vaginal spotting for the last several days.   External Medical Documents Reviewed: Discharge summary from June 2022 for C-section documenting past gestational history      Physical Exam   Triage Vital Signs: ED Triage Vitals  Encounter Vitals Group     BP 08/25/23 1629 126/61     Systolic BP Percentile --      Diastolic BP Percentile --      Pulse Rate 08/25/23 1629 65     Resp 08/25/23 1629 17     Temp 08/25/23 1629 98.8 F (37.1 C)     Temp Source 08/25/23 1629 Oral     SpO2 08/25/23 1629 99 %     Weight --      Height --      Head Circumference --      Peak Flow --      Pain Score 08/25/23 1643 5     Pain Loc --      Pain Education --      Exclude from Growth Chart --     Most recent vital signs: Vitals:   08/25/23 1629  BP: 126/61  Pulse: 65  Resp: 17  Temp: 98.8 F (37.1 C)  SpO2: 99%    General: Awake, no distress.  CV:  Good peripheral perfusion.  Resp:  Normal effort.  Abd:  No distention.  Other:  Comfortable appearing normal vital signs, mild right upper quadrant tenderness to palpation otherwise other  quadrants are nontender no rigidity or guarding.  No focal neurologic deficits including facial asymmetry, dysarthria, finger-nose, motor or sensory deficits.   ED Results / Procedures / Treatments   Labs (all labs ordered are listed, but only abnormal results are displayed) Labs Reviewed  BASIC METABOLIC PANEL - Abnormal; Notable for the following components:      Result Value   Calcium 8.5 (*)    All other components within normal limits  URINALYSIS, ROUTINE W REFLEX MICROSCOPIC - Abnormal; Notable for the following components:   Color, Urine YELLOW (*)    APPearance HAZY (*)    Ketones, ur 5 (*)    All other components within normal limits  PREGNANCY, URINE - Abnormal; Notable for the following components:   Preg Test, Ur POSITIVE (*)    All other components within normal limits  HCG, QUANTITATIVE, PREGNANCY - Abnormal; Notable for the following components:   hCG, Beta Chain, Quant, S 99,306 (*)    All other components within normal limits  CBC  POC URINE PREG, ED     I ordered and reviewed the above  labs they are notable for positive pregnancy test, H&H normal, urinalysis without bacteria, previous testing shows Rh+ blood.  EKG  ED ECG REPORT I, Pilar Jarvis, the attending physician, personally viewed and interpreted this ECG.   Date: 08/25/2023  EKG Time: 1634  Rate: 62  Rhythm: sinus  Axis: nl  Intervals:none  ST&T Change: no stemi or malignant dysrhythmia noted    RADIOLOGY I independently reviewed and interpreted right upper quadrant ultrasound and see no gallstones or evidence of cholecystitis I also reviewed radiologist's formal read.   PROCEDURES:  Critical Care performed: No  Procedures   MEDICATIONS ORDERED IN ED: Medications - No data to display  IMPRESSION / MDM / ASSESSMENT AND PLAN / ED COURSE  I reviewed the triage vital signs and the nursing notes.                                Patient's presentation is most consistent with acute  presentation with potential threat to life or bodily function.  Differential diagnosis includes, but is not limited to, cholecystitis, cholelithiasis, dysrhythmia, ACS, electrolyte derangement, ectopic pregnancy, acute blood loss anemia   The patient is on the cardiac monitor to evaluate for evidence of arrhythmia and/or significant heart rate changes.  MDM:    Right upper quadrant pain in the setting of known pregnancy.  Right upper quadrant ultrasound to assess for cholelithiasis or cholecystitis which was negative fortunately.  She has had some vaginal spotting and I am able to visualize a double decidual sign with fetal heart rate on bedside ultrasound intrauterine pregnancy.  Doubt ectopic pregnancy given natural conception highly unlikely heterotopic.  Rh+ defer RhoGAM.  Notified of threatened miscarriage.  Will follow-up with gynecology.         FINAL CLINICAL IMPRESSION(S) / ED DIAGNOSES   Final diagnoses:  RUQ pain  Near syncope  Threatened miscarriage     Rx / DC Orders   ED Discharge Orders     None        Note:  This document was prepared using Dragon voice recognition software and may include unintentional dictation errors.    Pilar Jarvis, MD 08/25/23 [phone]

## 2023-08-25 NOTE — ED Triage Notes (Addendum)
Pt with weakness x 2 weeks.  Right upper quadrant pain today and laid down in the shower.  Went on to school and had a syncopal episode/   No fall, lowered to floor.   Accompanied nausea and vomiting x 2 weeks.  Has positive pregnancy test.  LMP 07/05/23

## 2023-08-25 NOTE — Discharge Instructions (Addendum)
Your testing in the emergency department did not show any signs of emergency conditions that would require hospitalization or surgeries tonight.  I am not sure what caused your belly pain today.  It is important that you follow-up with your gynecologist and primary doctor for further evaluation and treatments as needed.   Make sure you stay hydrated by drinking plenty of fluids.  It seems that your pregnancy is in the uterus as it is supposed to be on your bedside ultrasound today.  Follow-up with your gynecologist for a more detailed examination -and to further evaluate your vaginal bleeding during early pregnancy.  It is impossible to tell whether this is the first sign of a miscarriage or whether the bleeding will resolve in your pregnancy will resume normally.  That is why it is especially important to follow-up with her gynecologist and keep a close eye and your symptoms return to the emergency department if you have experienced any new worsening or expected symptoms.

## 2023-09-06 ENCOUNTER — Ambulatory Visit: Payer: Medicaid Other | Admitting: Advanced Practice Midwife

## 2023-09-06 ENCOUNTER — Ambulatory Visit: Payer: Medicaid Other

## 2023-09-06 DIAGNOSIS — Z3480 Encounter for supervision of other normal pregnancy, unspecified trimester: Secondary | ICD-10-CM | POA: Diagnosis not present

## 2023-09-06 DIAGNOSIS — O99211 Obesity complicating pregnancy, first trimester: Secondary | ICD-10-CM

## 2023-09-06 NOTE — Progress Notes (Addendum)
Patient came to clinic for proof of pregnancy. Patient had positive PT at [facility] on 08/25/2023 and plans maternity care at [facility]. LMP was 07/05/23 and patient states it was a normal period. Patient given proof of pregnancy and plans to call for her first appointment. Burt Knack, RN

## 2023-09-23 ENCOUNTER — Inpatient Hospital Stay (HOSPITAL_COMMUNITY): Payer: Medicaid Other

## 2023-09-23 ENCOUNTER — Encounter (HOSPITAL_COMMUNITY): Payer: Self-pay | Admitting: *Deleted

## 2023-09-23 ENCOUNTER — Inpatient Hospital Stay (HOSPITAL_COMMUNITY)
Admission: AD | Admit: 2023-09-23 | Discharge: 2023-09-23 | Disposition: A | Discharge status: Home / Self Care | Payer: Medicaid Other | Attending: Obstetrics & Gynecology | Admitting: Obstetrics & Gynecology

## 2023-09-23 DIAGNOSIS — O4691 Antepartum hemorrhage, unspecified, first trimester: Secondary | ICD-10-CM | POA: Diagnosis not present

## 2023-09-23 DIAGNOSIS — Z3A11 11 weeks gestation of pregnancy: Secondary | ICD-10-CM | POA: Diagnosis not present

## 2023-09-23 DIAGNOSIS — R109 Unspecified abdominal pain: Secondary | ICD-10-CM | POA: Insufficient documentation

## 2023-09-23 DIAGNOSIS — O209 Hemorrhage in early pregnancy, unspecified: Secondary | ICD-10-CM

## 2023-09-23 DIAGNOSIS — O26891 Other specified pregnancy related conditions, first trimester: Secondary | ICD-10-CM | POA: Insufficient documentation

## 2023-09-23 DIAGNOSIS — B9689 Other specified bacterial agents as the cause of diseases classified elsewhere: Secondary | ICD-10-CM

## 2023-09-23 LAB — URINALYSIS, ROUTINE W REFLEX MICROSCOPIC
Bacteria, UA: NONE SEEN
Bilirubin Urine: NEGATIVE
Glucose, UA: NEGATIVE mg/dL
Ketones, ur: 20 mg/dL — AB
Leukocytes,Ua: NEGATIVE
Nitrite: NEGATIVE
Protein, ur: NEGATIVE mg/dL
Specific Gravity, Urine: 1.029 (ref 1.005–1.030)
pH: 5 (ref 5.0–8.0)

## 2023-09-23 LAB — WET PREP, GENITAL
Sperm: NONE SEEN
Trich, Wet Prep: NONE SEEN
WBC, Wet Prep HPF POC: >=10 — AB (ref <10)
Yeast Wet Prep HPF POC: NONE SEEN

## 2023-09-23 MED ORDER — METRONIDAZOLE 0.75 % VA GEL: 1.0000 | Freq: Every day | VAGINAL | 1 refills | Status: DC

## 2023-09-23 NOTE — MAU Provider Note (Signed)
History     CSN: [id number]  Arrival date and time: 09/23/23 2053   Event Date/Time   First Provider Initiated Contact with Patient 09/23/23 2158      Chief Complaint  Patient presents with   Vaginal Bleeding   Abdominal Pain    Gabriella Pennington is a 37 y.o. [id number] at [redacted]w[redacted]d who receives care at ***.  She presents today for vaginal bleeding.  She states bleeding started at 1930 and is light without clots.  She reports some abdominal and right flank pain that started with bleeding onset. She rates the pain a 6/10, but increases "when the sharp pain hits."  She reports this occurs in abdominal area and has no worsening or alleviating factors.   OB History     Gravida  5   Para  4   Term  4   Preterm  0   AB  0   Living  4      SAB  0   IAB  0   Ectopic  0   Multiple      Live Births  4           Past Medical History:  Diagnosis Date   COVID-19    Heart murmur    since birth    Past Surgical History:  Procedure Laterality Date   CESAREAN SECTION     (2007, 2008' 2015)   CESAREAN SECTION Bilateral 04/09/2021   Procedure: Repeat Cesarean Section;  Surgeon: Nadara Mustard, MD;  Location: [facility] ORS;  Service: Obstetrics;  Laterality: Bilateral;    No family history on file.  Social History   Tobacco Use   Smoking status: Every Day    Current packs/day: 0.20    Types: Cigarettes   Smokeless tobacco: Never  Vaping Use   Vaping status: Every Day   Start date: 04/11/2021   Substances: Nicotine  Substance Use Topics   Alcohol use: Not Currently    Alcohol/week: 4.0 standard drinks of alcohol    Types: 4 Shots of liquor per week    Comment: last use- 08/31/20   Drug use: Not Currently    Types: Marijuana    Comment: last marijuana use- 10 yrs ago    Allergies:  Allergies  Allergen Reactions   Penicillins Hives    Medications Prior to Admission  Medication Sig Dispense Refill Last Dose   metroNIDAZOLE (FLAGYL) 500 MG tablet Take 1 tablet  (500 mg total) by mouth 2 (two) times daily. (Patient not taking: Reported on 10/06/2022) 14 tablet 0     Review of Systems  Gastrointestinal:  Positive for abdominal pain, nausea (Prior to arrival) and vomiting. Negative for constipation and diarrhea.  Genitourinary:  Positive for vaginal bleeding. Negative for difficulty urinating, dysuria and vaginal discharge.  Musculoskeletal:  Positive for back pain.   Physical Exam   Blood pressure 121/69, pulse 93, temperature 97.9 F (36.6 C), temperature source Oral, resp. rate 16, height 5\' 3"  (1.6 m), weight 100.2 kg, last menstrual period 07/05/2023.  Physical Exam  MAU Course  Procedures Results for orders placed or performed during the hospital encounter of 09/23/23 (from the past 24 hour(s))  Wet prep, genital     Status: Abnormal   Collection Time: 09/23/23 10:05 PM  Result Value Ref Range   Yeast Wet Prep HPF POC NONE SEEN NONE SEEN   Trich, Wet Prep NONE SEEN NONE SEEN   Clue Cells Wet Prep HPF POC PRESENT (A) NONE SEEN  WBC, Wet Prep HPF POC >=10 (A) <10   Sperm NONE SEEN   Urinalysis, Routine w reflex microscopic -Urine, Clean Catch     Status: Abnormal   Collection Time: 09/23/23 10:20 PM  Result Value Ref Range   Color, Urine YELLOW YELLOW   APPearance CLEAR CLEAR   Specific Gravity, Urine 1.029 1.005 - 1.030   pH 5.0 5.0 - 8.0   Glucose, UA NEGATIVE NEGATIVE mg/dL   Hgb urine dipstick SMALL (A) NEGATIVE   Bilirubin Urine NEGATIVE NEGATIVE   Ketones, ur 20 (A) NEGATIVE mg/dL   Protein, ur NEGATIVE NEGATIVE mg/dL   Nitrite NEGATIVE NEGATIVE   Leukocytes,Ua NEGATIVE NEGATIVE   RBC / HPF 0-5 0 - 5 RBC/hpf   WBC, UA 0-5 0 - 5 WBC/hpf   Bacteria, UA NONE SEEN NONE SEEN   Squamous Epithelial / HPF 0-5 0 - 5 /HPF   Mucus PRESENT     MDM ***  Assessment and Plan  ***  Cherre Robins 09/23/2023, 9:58 PM   Reassessment (11:25 PM)

## 2023-09-23 NOTE — MAU Note (Addendum)
Was at at [facility] on 10-24 She fainted in Class  Was put on bedrest  Pt says has VB - started  at 730pm- tonight  -in her underwear at work. Now - pt says has scant amt - red . Feels cramps and back. 6/10- no meds for cramping

## 2023-09-26 LAB — GC/CHLAMYDIA PROBE AMP (~~LOC~~) NOT AT ARMC
Chlamydia: NEGATIVE
Comment: NEGATIVE
Comment: NORMAL
Neisseria Gonorrhea: NEGATIVE

## 2023-10-10 ENCOUNTER — Telehealth: Payer: Medicaid Other

## 2023-10-10 DIAGNOSIS — Z3689 Encounter for other specified antenatal screening: Secondary | ICD-10-CM

## 2023-10-10 DIAGNOSIS — Z3480 Encounter for supervision of other normal pregnancy, unspecified trimester: Secondary | ICD-10-CM | POA: Insufficient documentation

## 2023-10-10 NOTE — Patient Instructions (Signed)
Second Trimester of Pregnancy  The second trimester of pregnancy is from week 13 through week 27. This is months 4 through 6 of pregnancy. The second trimester is often a time when you feel your best. Your body has adjusted to being pregnant, and you begin to feel better physically. During the second trimester: Morning sickness has lessened or stopped completely. You may have more energy. You may have an increase in appetite. The second trimester is also a time when the unborn baby (fetus) is growing rapidly. At the end of the sixth month, the fetus may be up to 12 inches long and weigh about 1 pounds. You will likely begin to feel the baby move (quickening) between 16 and 20 weeks of pregnancy. Body changes during your second trimester Your body continues to go through many changes during your second trimester. The changes vary and generally return to normal after the baby is born. Physical changes Your weight will continue to increase. You will notice your lower abdomen bulging out. You may begin to get stretch marks on your hips, abdomen, and breasts. Your breasts will continue to grow and to become tender. Dark spots or blotches (chloasma or mask of pregnancy) may develop on your face. A dark line from your belly button to the pubic area (linea nigra) may appear. You may have changes in your hair. These can include thickening of your hair, rapid growth, and changes in texture. Some people also have hair loss during or after pregnancy, or hair that feels dry or thin. Health changes You may develop headaches. You may have heartburn. You may develop constipation. You may develop hemorrhoids or swollen, bulging veins (varicose veins). Your gums may bleed and may be sensitive to brushing and flossing. You may urinate more often because the fetus is pressing on your bladder. You may have back pain. This is caused by: Weight gain. Pregnancy hormones that are relaxing the joints in your  pelvis. A shift in weight and the muscles that support your balance. Follow these instructions at home: Medicines Follow your health care provider's instructions regarding medicine use. Specific medicines may be either safe or unsafe to take during pregnancy. Do not take any medicines unless approved by your health care provider. Take a prenatal vitamin that contains at least 600 micrograms (mcg) of folic acid. Eating and drinking Eat a healthy diet that includes fresh fruits and vegetables, whole grains, good sources of protein such as meat, eggs, or tofu, and low-fat dairy products. Avoid raw meat and unpasteurized juice, milk, and cheese. These carry germs that can harm you and your baby. You may need to take these actions to prevent or treat constipation: Drink enough fluid to keep your urine pale yellow. Eat foods that are high in fiber, such as beans, whole grains, and fresh fruits and vegetables. Limit foods that are high in fat and processed sugars, such as fried or sweet foods. Activity Exercise only as directed by your health care provider. Most people can continue their usual exercise routine during pregnancy. Try to exercise for 30 minutes at least 5 days a week. Stop exercising if you develop contractions in your uterus. Stop exercising if you develop pain or cramping in the lower abdomen or lower back. Avoid exercising if it is very hot or humid or if you are at a high altitude. Avoid heavy lifting. If you choose to, you may have sex unless your health care provider tells you not to. Relieving pain and discomfort Wear a supportive   bra to prevent discomfort from breast tenderness. Take warm sitz baths to soothe any pain or discomfort caused by hemorrhoids. Use hemorrhoid cream if your health care provider approves. Rest with your legs raised (elevated) if you have leg cramps or low back pain. If you develop varicose veins: Wear support hose as told by your health care  provider. Elevate your feet for 15 minutes, 3-4 times a day. Limit salt in your diet. Safety Wear your seat belt at all times when driving or riding in a car. Talk with your health care provider if someone is verbally or physically abusive to you. Lifestyle Do not use hot tubs, steam rooms, or saunas. Do not douche. Do not use tampons or scented sanitary pads. Avoid cat litter boxes and soil used by cats. These carry germs that can cause birth defects in the baby and possibly loss of the fetus by miscarriage or stillbirth. Do not use herbal remedies, alcohol, illegal drugs, or medicines that are not approved by your health care provider. Chemicals in these products can harm your baby. Do not use any products that contain nicotine or tobacco, such as cigarettes, e-cigarettes, and chewing tobacco. If you need help quitting, ask your health care provider. General instructions During a routine prenatal visit, your health care provider will do a physical exam and other tests. He or she will also discuss your overall health. Keep all follow-up visits. This is important. Ask your health care provider for a referral to a local prenatal education class. Ask for help if you have counseling or nutritional needs during pregnancy. Your health care provider can offer advice or refer you to specialists for help with various needs. Where to find more information American Pregnancy Association: americanpregnancy.org American College of Obstetricians and Gynecologists: acog.org/en/Womens%20Health/Pregnancy Office on Women's Health: womenshealth.gov/pregnancy Contact a health care provider if you have: A headache that does not go away when you take medicine. Vision changes or you see spots in front of your eyes. Mild pelvic cramps, pelvic pressure, or nagging pain in the abdominal area. Persistent nausea, vomiting, or diarrhea. A bad-smelling vaginal discharge or foul-smelling urine. Pain when you  urinate. Sudden or extreme swelling of your face, hands, ankles, feet, or legs. A fever. Get help right away if you: Have fluid leaking from your vagina. Have spotting or bleeding from your vagina. Have severe abdominal cramping or pain. Have difficulty breathing. Have chest pain. Have fainting spells. Have not felt your baby move for the time period told by your health care provider. Have new or increased pain, swelling, or redness in an arm or leg. Summary The second trimester of pregnancy is from week 13 through week 27 (months 4 through 6). Do not use herbal remedies, alcohol, illegal drugs, or medicines that are not approved by your health care provider. Chemicals in these products can harm your baby. Exercise only as directed by your health care provider. Most people can continue their usual exercise routine during pregnancy. Keep all follow-up visits. This is important. This information is not intended to replace advice given to you by your health care provider. Make sure you discuss any questions you have with your health care provider. Document Revised: 03/26/2020 Document Reviewed: 01/31/2020 Elsevier Patient Education  2024 [employer] First Trimester of Pregnancy  The first trimester of pregnancy starts on the first day of your last menstrual period until the end of week 12. This is also called months 1 through 3 of pregnancy. Body changes during your first trimester Your   body goes through many changes during pregnancy. The changes usually return to normal after your baby is born. Physical changes You may gain or lose weight. Your breasts may grow larger and hurt. The area around your nipples may get darker. Dark spots or blotches may develop on your face. You may have changes in your hair. Health changes You may feel like you might vomit (nauseous), and you may vomit. You may have heartburn. You may have headaches. You may have trouble pooping (constipation). Your  gums may bleed. Other changes You may get tired easily. You may pee (urinate) more often. Your menstrual periods will stop. You may not feel hungry. You may want to eat certain kinds of food. You may have changes in your emotions from day to day. You may have more dreams. Follow these instructions at home: Medicines Take over-the-counter and prescription medicines only as told by your doctor. Some medicines are not safe during pregnancy. Take a prenatal vitamin that contains at least 600 micrograms (mcg) of folic acid. Eating and drinking Eat healthy meals that include: Fresh fruits and vegetables. Whole grains. Good sources of protein, such as meat, eggs, or tofu. Low-fat dairy products. Avoid raw meat and unpasteurized juice, milk, and cheese. If you feel like you may vomit, or you vomit: Eat 4 or 5 small meals a day instead of 3 large meals. Try eating a few soda crackers. Drink liquids between meals instead of during meals. You may need to take these actions to prevent or treat trouble pooping: Drink enough fluids to keep your pee (urine) pale yellow. Eat foods that are high in fiber. These include beans, whole grains, and fresh fruits and vegetables. Limit foods that are high in fat and sugar. These include fried or sweet foods. Activity Exercise only as told by your doctor. Most people can do their usual exercise routine during pregnancy. Stop exercising if you have cramps or pain in your lower belly (abdomen) or low back. Do not exercise if it is too hot or too humid, or if you are in a place of great height (high altitude). Avoid heavy lifting. If you choose to, you may have sex unless your doctor tells you not to. Relieving pain and discomfort Wear a good support bra if your breasts are sore. Rest with your legs raised (elevated) if you have leg cramps or low back pain. If you have bulging veins (varicose veins) in your legs: Wear support hose as told by your  doctor. Raise your feet for 15 minutes, 3-4 times a day. Limit salt in your food. Safety Wear your seat belt at all times when you are in a car. Talk with your doctor if someone is hurting you or yelling at you. Talk with your doctor if you are feeling sad or have thoughts of hurting yourself. Lifestyle Do not use hot tubs, steam rooms, or saunas. Do not douche. Do not use tampons or scented sanitary pads. Do not use herbal medicines, illegal drugs, or medicines that are not approved by your doctor. Do not drink alcohol. Do not smoke or use any products that contain nicotine or tobacco. If you need help quitting, ask your doctor. Avoid cat litter boxes and soil that is used by cats. These carry germs that can cause harm to the baby and can cause a loss of your baby by miscarriage or stillbirth. General instructions Keep all follow-up visits. This is important. Ask for help if you need counseling or if you need help with   nutrition. Your doctor can give you advice or tell you where to go for help. Visit your dentist. At home, brush your teeth with a soft toothbrush. Floss gently. Write down your questions. Take them to your prenatal visits. Where to find more information American Pregnancy Association: americanpregnancy.org American College of Obstetricians and Gynecologists: www.acog.org Office on Women's Health: womenshealth.gov/pregnancy Contact a doctor if: You are dizzy. You have a fever. You have mild cramps or pressure in your lower belly. You have a nagging pain in your belly area. You continue to feel like you may vomit, you vomit, or you have watery poop (diarrhea) for 24 hours or longer. You have a bad-smelling fluid coming from your vagina. You have pain when you pee. You are exposed to a disease that spreads from person to person, such as chickenpox, measles, Zika virus, HIV, or hepatitis. Get help right away if: You have spotting or bleeding from your vagina. You have  very bad belly cramping or pain. You have shortness of breath or chest pain. You have any kind of injury, such as from a fall or a car crash. You have new or increased pain, swelling, or redness in an arm or leg. Summary The first trimester of pregnancy starts on the first day of your last menstrual period until the end of week 12 (months 1 through 3). Eat 4 or 5 small meals a day instead of 3 large meals. Do not smoke or use any products that contain nicotine or tobacco. If you need help quitting, ask your doctor. Keep all follow-up visits. This information is not intended to replace advice given to you by your health care provider. Make sure you discuss any questions you have with your health care provider. Document Revised: 03/26/2020 Document Reviewed: 01/31/2020 Elsevier Patient Education  2024 Elsevier Inc. Commonly Asked Questions During Pregnancy  Cats: A parasite can be excreted in cat feces.  To avoid exposure you need to have another person empty the little box.  If you must empty the litter box you will need to wear gloves.  Wash your hands after handling your cat.  This parasite can also be found in raw or undercooked meat so this should also be avoided.  Colds, Sore Throats, Flu: Please check your medication sheet to see what you can take for symptoms.  If your symptoms are unrelieved by these medications please call the office.  Dental Work: Most any dental work your dentist recommends is permitted.  X-rays should only be taken during the first trimester if absolutely necessary.  Your abdomen should be shielded with a lead apron during all x-rays.  Please notify your provider prior to receiving any x-rays.  Novocaine is fine; gas is not recommended.  If your dentist requires a note from us prior to dental work please call the office and we will provide one for you.  Exercise: Exercise is an important part of staying healthy during your pregnancy.  You may continue most exercises  you were accustomed to prior to pregnancy.  Later in your pregnancy you will most likely notice you have difficulty with activities requiring balance like riding a bicycle.  It is important that you listen to your body and avoid activities that put you at a higher risk of falling.  Adequate rest and staying well hydrated are a must!  If you have questions about the safety of specific activities ask your provider.    Exposure to Children with illness: Try to avoid obvious exposure; report any   symptoms to us when noted,  If you have chicken pos, red measles or mumps, you should be immune to these diseases.   Please do not take any vaccines while pregnant unless you have checked with your OB provider.  Fetal Movement: After 28 weeks we recommend you do "kick counts" twice daily.  Lie or sit down in a calm quiet environment and count your baby movements "kicks".  You should feel your baby at least 10 times per hour.  If you have not felt 10 kicks within the first hour get up, walk around and have something sweet to eat or drink then repeat for an additional hour.  If count remains less than 10 per hour notify your provider.  Fumigating: Follow your pest control agent's advice as to how long to stay out of your home.  Ventilate the area well before re-entering.  Hemorrhoids:   Most over-the-counter preparations can be used during pregnancy.  Check your medication to see what is safe to use.  It is important to use a stool softener or fiber in your diet and to drink lots of liquids.  If hemorrhoids seem to be getting worse please call the office.   Hot Tubs:  Hot tubs Jacuzzis and saunas are not recommended while pregnant.  These increase your internal body temperature and should be avoided.  Intercourse:  Sexual intercourse is safe during pregnancy as long as you are comfortable, unless otherwise advised by your provider.  Spotting may occur after intercourse; report any bright red bleeding that is heavier  than spotting.  Labor:  If you know that you are in labor, please go to the hospital.  If you are unsure, please call the office and let us help you decide what to do.  Lifting, straining, etc:  If your job requires heavy lifting or straining please check with your provider for any limitations.  Generally, you should not lift items heavier than that you can lift simply with your hands and arms (no back muscles)  Painting:  Paint fumes do not harm your pregnancy, but may make you ill and should be avoided if possible.  Latex or water based paints have less odor than oils.  Use adequate ventilation while painting.  Permanents & Hair Color:  Chemicals in hair dyes are not recommended as they cause increase hair dryness which can increase hair loss during pregnancy.  " Highlighting" and permanents are allowed.  Dye may be absorbed differently and permanents may not hold as well during pregnancy.  Sunbathing:  Use a sunscreen, as skin burns easily during pregnancy.  Drink plenty of fluids; avoid over heating.  Tanning Beds:  Because their possible side effects are still unknown, tanning beds are not recommended.  Ultrasound Scans:  Routine ultrasounds are performed at approximately 20 weeks.  You will be able to see your baby's general anatomy an if you would like to know the gender this can usually be determined as well.  If it is questionable when you conceived you may also receive an ultrasound early in your pregnancy for dating purposes.  Otherwise ultrasound exams are not routinely performed unless there is a medical necessity.  Although you can request a scan we ask that you pay for it when conducted because insurance does not cover " patient request" scans.  Work: If your pregnancy proceeds without complications you may work until your due date, unless your physician or employer advises otherwise.  Round Ligament Pain/Pelvic Discomfort:  Sharp, shooting pains not associated   with bleeding are  fairly common, usually occurring in the second trimester of pregnancy.  They tend to be worse when standing up or when you remain standing for long periods of time.  These are the result of pressure of certain pelvic ligaments called "round ligaments".  Rest, Tylenol and heat seem to be the most effective relief.  As the womb and fetus grow, they rise out of the pelvis and the discomfort improves.  Please notify the office if your pain seems different than that described.  It may represent a more serious condition.  Common Medications Safe in Pregnancy  Acne:      Constipation:  Benzoyl Peroxide     Colace  Clindamycin      Dulcolax Suppository  Topica Erythromycin     Fibercon  Salicylic Acid      Metamucil         Miralax AVOID:        Senakot   Accutane    Cough:  Retin-A       Cough Drops  Tetracycline      Phenergan w/ Codeine if Rx  Minocycline      Robitussin (Plain & DM)  Antibiotics:     Crabs/Lice:  Ceclor       RID  Cephalosporins    AVOID:  E-Mycins      Kwell  Keflex  Macrobid/Macrodantin   Diarrhea:  Penicillin      Kao-Pectate  Zithromax      Imodium AD         PUSH FLUIDS AVOID:       Cipro     Fever:  Tetracycline      Tylenol (Regular or Extra  Minocycline       Strength)  Levaquin      Extra Strength-Do not          Exceed 8 tabs/24 hrs Caffeine:        <200mg/day (equiv. To 1 cup of coffee or  approx. 3 12 oz sodas)         Gas: Cold/Hayfever:       Gas-X  Benadryl      Mylicon  Claritin       Phazyme  **Claritin-D        Chlor-Trimeton    Headaches:  Dimetapp      ASA-Free Excedrin  Drixoral-Non-Drowsy     Cold Compress  Mucinex (Guaifenasin)     Tylenol (Regular or Extra  Sudafed/Sudafed-12 Hour     Strength)  **Sudafed PE Pseudoephedrine   Tylenol Cold & Sinus     Vicks Vapor Rub  Zyrtec  **AVOID if Problems With Blood Pressure         Heartburn: Avoid lying down for at least 1 hour after meals  Aciphex      Maalox     Rash:  Milk of  Magnesia     Benadryl    Mylanta       1% Hydrocortisone Cream  Pepcid  Pepcid Complete   Sleep Aids:  Prevacid      Ambien   Prilosec       Benadryl  Rolaids       Chamomile Tea  Tums (Limit 4/day)     Unisom         Tylenol PM         Warm milk-add vanilla or  Hemorrhoids:       Sugar for taste  Anusol/Anusol H.C.  (RX: Analapram 2.5%)  Sugar Substitutes:    Hydrocortisone OTC     Ok in moderation  Preparation H      Tucks        Vaseline lotion applied to tissue with wiping    Herpes:     Throat:  Acyclovir      Oragel  Famvir  Valtrex     Vaccines:         Flu Shot Leg Cramps:       *Gardasil  Benadryl      Hepatitis A         Hepatitis B Nasal Spray:       Pneumovax  Saline Nasal Spray     Polio Booster         Tetanus Nausea:       Tuberculosis test or PPD  Vitamin B6 25 mg TID   AVOID:    Dramamine      *Gardasil  Emetrol       Live Poliovirus  Ginger Root 250 mg QID    MMR (measles, mumps &  High Complex Carbs @ Bedtime    rebella)  Sea Bands-Accupressure    Varicella (Chickenpox)  Unisom 1/2 tab TID     *No known complications           If received before Pain:         Known pregnancy;   Darvocet       Resume series after  Lortab        Delivery  Percocet    Yeast:   Tramadol      Femstat  Tylenol 3      Gyne-lotrimin  Ultram       Monistat  Vicodin           MISC:         All Sunscreens           Hair Coloring/highlights          Insect Repellant's          (Including DEET)         Mystic Tans  

## 2023-10-10 NOTE — Progress Notes (Addendum)
New OB Intake  I connected with  Tora Perches on 10/10/23 at  2:15 PM EST by telephone as pt couldn't get her microphone on and verified that I am speaking with the correct person using two identifiers. Nurse is located at [facility] and pt is located in car.  I explained I am completing New OB Intake today. We discussed her EDD of 04/06/2024 (pt prefers this date over the 10th) that is based on u/s of [redacted]w[redacted]d. Pt is G5/P4004. I reviewed her allergies, medications, Medical/Surgical/OB history, and appropriate screenings. There are no cats in the home.  Based on history, this is a/an pregnancy uncomplicated . Her obstetrical history is significant for obesity.  Patient Active Problem List   Diagnosis Date Noted   Supervision of other normal pregnancy, antepartum 10/10/2023    Concerns addressed today Pt states she went to ER with heart palpitations but was never told anything. 2. Pt wants to use the June 6th [facility] so the planned c/s can be scheduled at a certain time hopefully.  Delivery Plans:  Plans to deliver at [facility].  Anatomy US Explained first scheduled [place] was Nov 22nd and an anatomy scan will be done at 20 weeks.  Labs Discussed genetic screening with patient. Patient desires genetic testing to be drawn at new OB visit. Discussed possible labs to be drawn at new OB appointment.  COVID Vaccine Patient has not had COVID vaccine.   Social Determinants of Health Food Insecurity: expresses food insecurity. Information given on local food banks. Transportation: Patient denies transportation needs. Childcare: Discussed no children allowed at ultrasound appointments.   First visit review I reviewed new OB appt with pt. I explained she will have ob bloodwork and pap smear/pelvic exam if indicated. Explained pt will be seen by an AOB Provider at first visit; encounter routed to appropriate provider.   Loran Senters, [facility] 10/10/2023  2:46 PM

## 2023-10-14 ENCOUNTER — Ambulatory Visit (INDEPENDENT_AMBULATORY_CARE_PROVIDER_SITE_OTHER): Payer: Medicaid Other | Admitting: Licensed Practical Nurse

## 2023-10-14 ENCOUNTER — Encounter: Payer: Self-pay | Admitting: Licensed Practical Nurse

## 2023-10-14 ENCOUNTER — Other Ambulatory Visit (HOSPITAL_COMMUNITY)
Admission: RE | Admit: 2023-10-14 | Discharge: 2023-10-14 | Disposition: A | Discharge status: Home / Self Care | Payer: Medicaid Other | Source: Ambulatory Visit | Attending: Licensed Practical Nurse | Admitting: Licensed Practical Nurse

## 2023-10-14 VITALS — BP 104/57 | HR 83 | Wt 225.8 lb

## 2023-10-14 DIAGNOSIS — O09522 Supervision of elderly multigravida, second trimester: Secondary | ICD-10-CM

## 2023-10-14 DIAGNOSIS — Z3A15 15 weeks gestation of pregnancy: Secondary | ICD-10-CM

## 2023-10-14 DIAGNOSIS — Z3480 Encounter for supervision of other normal pregnancy, unspecified trimester: Secondary | ICD-10-CM | POA: Diagnosis present

## 2023-10-14 DIAGNOSIS — Z1379 Encounter for other screening for genetic and chromosomal anomalies: Secondary | ICD-10-CM

## 2023-10-14 LAB — POCT URINALYSIS DIPSTICK
Bilirubin, UA: NEGATIVE
Blood, UA: NEGATIVE
Glucose, UA: NEGATIVE
Ketones, UA: NEGATIVE
Leukocytes, UA: NEGATIVE
Nitrite, UA: NEGATIVE
Protein, UA: NEGATIVE
Spec Grav, UA: 1.015 (ref 1.010–1.025)
Urobilinogen, UA: 0.2 U/dL
pH, UA: 7 (ref 5.0–8.0)

## 2023-10-14 NOTE — Progress Notes (Unsigned)
New Obstetric Patient H&P    Chief Complaint: "Desires prenatal care"   History of Present Illness: Patient is a 37 y.o. [id number] Not Hispanic or Latino female, presents with amenorrhea and positive home pregnancy test. Patient's last menstrual period was 07/05/2023 (exact date). and based on her  LMP, her EDD is Estimated Date of Delivery: 04/06/24 and her EGA is [redacted]w[redacted]d. Cycles are {0-35:19561} {days/wks/mos/yrs:310907}, {Desc; regular/irreg:14544}, and occur approximately every : {numbers 22-35:14824} days. Her last pap smear was {numbers (fuzzy):14653} years ago and was {Findings; lab pap smear results:16707::"no abnormalities"}.    She had a urine pregnancy test which was positive {numbers (fuzzy):14653} {time frame:9076}  ago. Her last menstrual period was normal and lasted for  {numbers (fuzzy):14653} {time frame:9076}. Since her LMP she claims she has experienced ***. She denies vaginal bleeding. Her past medical history is {Noncontribuatory/Contributory:21644}. Her prior pregnancies are notable for {pregnancy complications:12320}  Since her LMP, she admits to the use of tobacco products  {yes/no:63} She claims she has gained   {inf wt change:14817} pounds since the start of her pregnancy.  There are cats in the home in the home  {yes/no:63} If yes {Desc; indoor/outdoor:13239} She admits close contact with children on a regular basis  {yes/no:63}  She has had chicken pox in the past {yes/no/unknown:74} She has had Tuberculosis exposures, symptoms, or previously tested positive for TB   {yes/no:63} Current or past history of domestic violence. {yes/no:63}  Genetic Screening/Teratology Counseling: (Includes patient, baby's father, or anyone in either family with:)   1. Patient's age >/= 8 at [facility]  {yes/no:63} 2. Thalassemia ([place], [place], Mediterranean, or Asian background): MCV<80  {yes/no:63} 3. Neural tube defect (meningomyelocele, spina bifida, anencephaly)  {yes/no:63} 4.  Congenital heart defect  {yes/no:63}  5. Down syndrome  {yes/no:63} 6. Tay-Sachs (Jewish, [place]  {yes/no:63} 7. Canavan's Disease  {yes/no:63} 8. Sickle cell disease or trait (African)  {yes/no:63}  9. Hemophilia or other blood disorders  {yes/no:63}  10. Muscular dystrophy  {yes/no:63}  11. Cystic fibrosis  {yes/no:63}  12. Huntington's Chorea  {yes/no:63}  13. Mental retardation/autism  {yes/no:63} 14. Other inherited genetic or chromosomal disorder  {yes/no:63} 15. Maternal metabolic disorder (DM, PKU, etc)  {yes/no:63} 16. Patient or FOB with a child with a birth defect not listed above no  16a. Patient or FOB with a birth defect themselves {yes/no:63} 17. Recurrent pregnancy loss, or stillbirth  {yes/no:63}  18. Any medications since LMP other than prenatal vitamins (include vitamins, supplements, OTC meds, drugs, alcohol)  {yes/no:63} 19. Any other genetic/environmental exposure to discuss  {yes/no:63}  Infection History:   1. Lives with someone with TB or TB exposed  {yes/no:63}  2. Patient or partner has history of genital herpes  {yes/no:63} 3. Rash or viral illness since LMP  {yes/no:63} 4. History of STI (GC, CT, HPV, syphilis, HIV)  {yes/no:63} 5. History of recent travel :  {yes/no:63}  Other pertinent information:  {yes/no:63}     Review of Systems:10 point review of systems negative unless otherwise noted in HPI  Past Medical History:  Patient Active Problem List   Diagnosis Date Noted Date Diagnosed   Supervision of other normal pregnancy, antepartum 10/10/2023      Clinical Staff Provider  Office Location  [facility] Dating  04/10/2024, by Last Menstrual Period  Language  English Anatomy US    Flu Vaccine  offer Genetic Screen  NIPS:   TDaP vaccine   offer Hgb A1C or  GTT Early : Third trimester :  Covid declined   LAB RESULTS   Rhogam     Blood Type   O+  04/09/21  RSV offer Antibody  Neg  04/09/21  Feeding Plan formula Rubella  Immune   10/16/20  Contraception nexplanon RPR   Non Reactive  04/08/21  Circumcision yes HBsAg   Neg  10/16/20  Pediatrician  [facility] HIV  Non Reactive  04/08/21  Support Person Daughter, Marcial Pacas Varicella    Prenatal Classes no GBS  (For PCN allergy, check sensitivities)     Hep C     BTL Consent  Pap Neg; HPV neg  10/03/20  VBAC Consent  Hgb Electro      CF   GC/CHLM NEG/NEG  09/23/23 SMA              Past Surgical History:  Past Surgical History:  Procedure Laterality Date   CESAREAN SECTION     (2007, 2008' 2015)   CESAREAN SECTION Bilateral 04/09/2021   Procedure: Repeat Cesarean Section;  Surgeon: Nadara Mustard, MD;  Location: [facility] ORS;  Service: Obstetrics;  Laterality: Bilateral;   TONSILLECTOMY     age 10/10   WISDOM TOOTH EXTRACTION  2022   [age];    Gynecologic History: Patient's last menstrual period was 07/05/2023 (exact date).  Obstetric History: [id number]  Family History:  Family History  Problem Relation Age of Onset   Healthy Mother    Healthy Father    Asthma Brother    Cancer Maternal Grandmother        colon   Healthy Maternal Grandfather    Healthy Paternal Grandmother    Healthy Paternal Grandfather     Social History:  Social History   Socioeconomic History   Marital status: Single    Spouse name: Not on file   Number of children: 4   Years of education: 12.5   Highest education level: Not on file  Occupational History   Occupation: Production designer, theatre/television/film at [employer]  Tobacco Use   Smoking status: Former    Current packs/day: 0.20    Types: Cigarettes   Smokeless tobacco: Never  Vaping Use   Vaping status: Every Day   Start date: 04/11/2021   Substances: Nicotine   Devices: trying to quit  Substance and Sexual Activity   Alcohol use: Not Currently    Alcohol/week: 4.0 standard drinks of alcohol    Types: 4 Shots of liquor per week    Comment: last use- 08/31/20   Drug use: Not Currently    Types: Marijuana    Comment: last marijuana use- 10 yrs  ago   Sexual activity: Yes    Partners: Male  Other Topics Concern   Not on file  Social History Narrative   ** Merged History Encounter **       ** Merged History Encounter **       Social Drivers of Corporate investment banker Strain: High Risk (10/10/2023)   Overall Financial Resource Strain (CARDIA)    Difficulty of Paying Living Expenses: Very hard  Food Insecurity: Food Insecurity Present (10/10/2023)   Hunger Vital Sign    Worried About Running Out of Food in the Last Year: Often true    Ran Out of Food in the Last Year: Never true  Transportation Needs: No Transportation Needs (10/10/2023)   PRAPARE - Administrator, Civil Service (Medical): No    Lack of Transportation (Non-Medical): No  Physical Activity: Inactive (10/10/2023)   Exercise Vital Sign  Days of Exercise per Week: 0 days    Minutes of Exercise per Session: 0 min  Stress: Stress Concern Present (10/10/2023)   [employer] of Occupational Health - Occupational Stress Questionnaire    Feeling of Stress : To some extent  Social Connections: Unknown (10/10/2023)   Social Connection and Isolation Panel [NHANES]    Frequency of Communication with Friends and Family: More than three times a week    Frequency of Social Gatherings with Friends and Family: Twice a week    Attends Religious Services: Never    Database administrator or Organizations: No    Attends Banker Meetings: Never    Marital Status: Not on file  Intimate Partner Violence: Not At Risk (10/10/2023)   Humiliation, Afraid, Rape, and Kick questionnaire    Fear of Current or Ex-Partner: No    Emotionally Abused: No    Physically Abused: No    Sexually Abused: No    Allergies:  Allergies  Allergen Reactions   Penicillins Hives    Medications: Prior to Admission medications   Medication Sig Start Date End Date Taking? Authorizing Provider  metroNIDAZOLE (METROGEL) 0.75 % vaginal gel Place 1 Applicatorful  vaginally at bedtime. Apply one applicatorful to vagina at bedtime for 5 days 09/23/23  Yes Gerrit Heck, CNM  Prenatal Vit-Fe Fumarate-FA (MULTIVITAMIN-PRENATAL) 27-0.8 MG TABS tablet Take 1 tablet by mouth daily at 12 noon.   Yes [provider]    Physical Exam Vitals: Blood pressure (!) 104/57, pulse 83, weight 225 lb 12.8 oz (102.4 kg), last menstrual period 07/05/2023.  General: NAD HEENT: normocephalic, anicteric Thyroid: no enlargement, no palpable nodules Pulmonary: No increased work of breathing, CTAB Cardiovascular: RRR, distal pulses 2+ Abdomen: NABS, soft, non-tender, non-distended.  Umbilicus without lesions.  No hepatomegaly, splenomegaly or masses palpable. No evidence of hernia  Genitourinary:  External: Normal external female genitalia.  Normal urethral meatus, normal  Bartholin's and Skene's glands.    Vagina: Normal vaginal mucosa, no evidence of prolapse.    Cervix: Grossly normal in appearance, no bleeding  Uterus: *** Non-enlarged, mobile, normal contour.  No CMT  Adnexa: ovaries non-enlarged, no adnexal masses  Rectal: deferred Extremities: no edema, erythema, or tenderness Neurologic: Grossly intact Psychiatric: mood appropriate, affect full   Assessment: 37 y.o. [id number] at [redacted]w[redacted]d presenting to initiate prenatal care  Plan: 1) Avoid alcoholic beverages. 2) Patient encouraged not to smoke.  3) Discontinue the use of all non-medicinal drugs and chemicals.  4) Take prenatal vitamins daily.  5) Nutrition, food safety (fish, cheese advisories, and high nitrite foods) and exercise discussed. 6) Hospital and practice style discussed with cross coverage system.  7) Genetic Screening, such as with 1st Trimester Screening, cell free fetal DNA, AFP testing, and Ultrasound, as well as with amniocentesis and CVS as appropriate, is discussed with patient. At the conclusion of today's visit patient {Desc; requested/declined/undecided:14580} genetic testing 8)  Patient is asked about travel to areas at risk for the Zika virus, and counseled to avoid travel and exposure to mosquitoes or sexual partners who may have themselves been exposed to the virus. Testing is discussed, and will be ordered as appropriate.   Carie Caddy, CNM  [facility], [facility] 10/14/2023, 1:53 PM

## 2023-10-15 LAB — URINALYSIS, ROUTINE W REFLEX MICROSCOPIC
Bilirubin, UA: NEGATIVE
Glucose, UA: NEGATIVE
Ketones, UA: NEGATIVE
Leukocytes,UA: NEGATIVE
Nitrite, UA: NEGATIVE
RBC, UA: NEGATIVE
Specific Gravity, UA: 1.024 (ref 1.005–1.030)
Urobilinogen, Ur: 0.2 mg/dL (ref 0.2–1.0)
pH, UA: 7.5 (ref 5.0–7.5)

## 2023-10-16 LAB — CULTURE, OB URINE

## 2023-10-16 LAB — URINE CULTURE, OB REFLEX

## 2023-10-18 ENCOUNTER — Other Ambulatory Visit: Payer: Self-pay | Admitting: Licensed Practical Nurse

## 2023-10-18 LAB — CERVICOVAGINAL ANCILLARY ONLY
Bacterial Vaginitis (gardnerella): POSITIVE — AB
Candida Glabrata: NEGATIVE
Candida Vaginitis: NEGATIVE
Chlamydia: NEGATIVE
Comment: NEGATIVE
Comment: NEGATIVE
Comment: NEGATIVE
Comment: NEGATIVE
Comment: NEGATIVE
Comment: NORMAL
Neisseria Gonorrhea: NEGATIVE
Trichomonas: NEGATIVE

## 2023-10-18 MED ORDER — ASPIRIN 81 MG PO TBEC: 81.0000 mg | DELAYED_RELEASE_TABLET | Freq: Every day | ORAL | Status: DC

## 2023-10-18 MED ORDER — METRONIDAZOLE 0.75 % VA GEL: 1.0000 | Freq: Every day | VAGINAL | 1 refills | Status: DC

## 2023-10-18 NOTE — Progress Notes (Signed)
Pt seen for NOB, swab shows BV. Pt called. Pt reports she was given cream for BV when she went to the  hospital, she did complete  the medication, has some remaining.  Will retreat with Metrogel Script sent to pharmacy  Carie Caddy, CNM  [facility]  10/18/23  4:19 PM

## 2023-10-19 LAB — CBC/D/PLT+RPR+RH+ABO+RUBIGG...
Antibody Screen: NEGATIVE
Basophils Absolute: 0.1 10*3/uL (ref 0.0–0.2)
Basos: 1 %
EOS (ABSOLUTE): 0.3 10*3/uL (ref 0.0–0.4)
Eos: 4 %
HCV Ab: NONREACTIVE
HIV Screen 4th Generation wRfx: NONREACTIVE
Hematocrit: 40.3 % (ref 34.0–46.6)
Hemoglobin: 12.9 g/dL (ref 11.1–15.9)
Hepatitis B Surface Ag: NEGATIVE
Immature Grans (Abs): 0 10*3/uL (ref 0.0–0.1)
Immature Granulocytes: 0 %
Lymphocytes Absolute: 2.4 10*3/uL (ref 0.7–3.1)
Lymphs: 29 %
MCH: 29.1 pg (ref 26.6–33.0)
MCHC: 32 g/dL (ref 31.5–35.7)
MCV: 91 fL (ref 79–97)
Monocytes Absolute: 0.4 10*3/uL (ref 0.1–0.9)
Monocytes: 5 %
Neutrophils Absolute: 4.9 10*3/uL (ref 1.4–7.0)
Neutrophils: 61 %
Platelets: 229 10*3/uL (ref 150–450)
RBC: 4.43 x10E6/uL (ref 3.77–5.28)
RDW: 12.5 % (ref 11.7–15.4)
RPR Ser Ql: NONREACTIVE
Rh Factor: POSITIVE
Rubella Antibodies, IGG: 1.66 {index} (ref >0.99)
Varicella zoster IgG: REACTIVE
WBC: 8.1 10*3/uL (ref 3.4–10.8)

## 2023-10-19 LAB — HGB FRACTIONATION CASCADE
Hgb A2: 3 % (ref 1.8–3.2)
Hgb A: 97 % (ref 96.4–98.8)
Hgb F: 0 % (ref 0.0–2.0)
Hgb S: 0 %

## 2023-10-19 LAB — HEMOGLOBIN A1C
Est. average glucose Bld gHb Est-mCnc: 100 mg/dL
Hgb A1c MFr Bld: 5.1 % (ref 4.8–5.6)

## 2023-10-19 LAB — HCV INTERPRETATION

## 2023-10-20 LAB — MATERNIT 21 PLUS CORE, BLOOD
Fetal Fraction: 16
Result (T21): NEGATIVE
Trisomy 13 (Patau syndrome): NEGATIVE
Trisomy 18 (Edwards syndrome): NEGATIVE
Trisomy 21 (Down syndrome): NEGATIVE

## 2023-10-24 LAB — MONITOR DRUG PROFILE 14(MW)

## 2023-10-24 LAB — NICOTINE SCREEN, URINE

## 2023-11-11 ENCOUNTER — Ambulatory Visit (INDEPENDENT_AMBULATORY_CARE_PROVIDER_SITE_OTHER): Payer: Medicaid Other

## 2023-11-11 ENCOUNTER — Ambulatory Visit (INDEPENDENT_AMBULATORY_CARE_PROVIDER_SITE_OTHER): Payer: Medicaid Other | Admitting: Certified Nurse Midwife

## 2023-11-11 VITALS — BP 117/73 | HR 79 | Wt 225.0 lb

## 2023-11-11 DIAGNOSIS — Z3480 Encounter for supervision of other normal pregnancy, unspecified trimester: Secondary | ICD-10-CM

## 2023-11-11 DIAGNOSIS — Z3482 Encounter for supervision of other normal pregnancy, second trimester: Secondary | ICD-10-CM

## 2023-11-11 DIAGNOSIS — Z3A19 19 weeks gestation of pregnancy: Secondary | ICD-10-CM

## 2023-11-11 DIAGNOSIS — Z3A15 15 weeks gestation of pregnancy: Secondary | ICD-10-CM

## 2023-11-11 LAB — POCT URINALYSIS DIPSTICK OB
Bilirubin, UA: NEGATIVE
Glucose, UA: NEGATIVE
Ketones, UA: NEGATIVE
Leukocytes, UA: NEGATIVE
Nitrite, UA: NEGATIVE
Spec Grav, UA: 1.02 — NL (ref 1.010–1.025)
Urobilinogen, UA: 0.2 U/dL
pH, UA: 5 (ref 5.0–8.0)

## 2023-11-11 NOTE — Progress Notes (Signed)
 ROB doing well, anatomy u/s today. Preliminary results reviewed. She will need to return to complete . She expresses decreased appetite. Discussed use of protein shakes. She agrees.   Follow up 2 weeks for completion of anatomy,   4 weeks for ROB.   Zelda Hummer, CNM

## 2023-11-11 NOTE — Patient Instructions (Signed)
 Round Ligament Pain  The round ligaments are a pair of cord-like tissues that help support the uterus. They can become a source of pain during pregnancy as the ligaments soften and stretch as the baby grows. The pain usually begins in the second trimester (13-28 weeks) of pregnancy, and should only last for a few seconds when it occurs. However, the pain can come and go until the baby is delivered. The pain does not cause harm to the baby. Round ligament pain is usually a short, sharp, and pinching pain, but it can also be a dull, lingering, and aching pain. The pain is felt in the lower side of the abdomen or in the groin. It usually starts deep in the groin and moves up to the outside of the hip area. The pain may happen when you: Suddenly change position, such as quickly going from a sitting to standing position. Do physical activity. Cough or sneeze. Follow these instructions at home: Managing pain  When the pain starts, relax. Then, try any of these methods to help with the pain: Sit down. Flex your knees up to your abdomen. Lie on your side with one pillow under your abdomen and another pillow between your legs. Sit in a warm bath for 15-20 minutes or until the pain goes away. General instructions Watch your condition for any changes. Move slowly when you sit down or stand up. Stop or reduce your physical activities if they cause pain. Avoid long walks if they cause pain. Take over-the-counter and prescription medicines only as told by your health care provider. Keep all follow-up visits. This is important. Contact a health care provider if: Your pain does not go away with treatment. You feel pain in your back that you did not have before. Your medicine is not helping. You have a fever or chills. You have nausea or vomiting. You have diarrhea. You have pain when you urinate. Get help right away if: You have pain that is a rhythmic, cramping pain similar to labor pains. Labor  pains are usually 2 minutes apart, last for about 1 minute, and involve a bearing down feeling or pressure in your pelvis. You have vaginal bleeding. These symptoms may represent a serious problem that is an emergency. Do not wait to see if the symptoms will go away. Get medical help right away. Call your local emergency services (911 in the [place] Do not drive yourself to the hospital. Summary Round ligament pain is felt in the lower abdomen or groin. This pain usually begins in the second trimester (13-28 weeks) and should only last for a few seconds when it occurs. You may notice the pain when you suddenly change position, when you cough or sneeze, or during physical activity. Relaxing, flexing your knees to your abdomen, lying on one side, or taking a warm bath may help to get rid of the pain. Contact your health care provider if the pain does not go away. This information is not intended to replace advice given to you by your health care provider. Make sure you discuss any questions you have with your health care provider. Document Revised: 12/31/2020 Document Reviewed: 12/31/2020 Elsevier Patient Education  2024 [employer].

## 2023-11-11 NOTE — Progress Notes (Signed)
 Patient Name: Gabriella Pennington DOB: Sep 08, 1986 MRN: [id number] LMP:    ULTRASOUND REPORT  Location: [facility] Date of Service: 11/11/2023   Indications:Anatomy Ultrasound Findings:  Gerri intrauterine pregnancy is visualized with FHR at 137 bpm.   Biometrics gives an (U/S) Gestational age of [redacted]w[redacted]d and an (U/S) EDD of 04/06/2024; this correlates with the clinically established Estimated Date of Delivery: 04/06/24   Fetal presentation is Breech.  EFW: 290g, 0lb 10 oz Placenta: anterior  MVP: 5.19 cm    Profile, nose/lip, posterior fossa, choroid plexus, lateral ventricle, cavum septi pellucidi,  stomach, bladder, kidneys, abdominal cord insert, upper extremities, lower extremities, hands, feet and situs were all visualized during todays exam.  Anatomic survey is incomplete for spine, heart views, diaphragm  Gender - female.    Cervix appears long and closed measuring 3.6.  Right Ovary: is normal in appearance. Left Ovary: is normal appearance. Survey of the adnexa demonstrates no adnexal masses.  There is no free peritoneal fluid in the cul de sac.  Impression: 1. [redacted]w[redacted]d Viable Singleton Intrauterine pregnancy by U/S. 2. (U/S) EDD is consistent with Clinically established Estimated Date of Delivery: 04/06/24 . 3. Incomplete anatomy for spine, heart views, diaphragm, placental cord insert  Recommendations: 1.Clinical correlation with the patient's History and Physical Exam. 2. Follow up anatomy in 4 weeks  Delon Seamen, RDMS

## 2023-11-29 ENCOUNTER — Ambulatory Visit: Payer: Medicaid Other

## 2023-11-29 ENCOUNTER — Other Ambulatory Visit: Payer: Self-pay | Admitting: Certified Nurse Midwife

## 2023-11-29 DIAGNOSIS — Z362 Encounter for other antenatal screening follow-up: Secondary | ICD-10-CM

## 2023-11-29 DIAGNOSIS — O99212 Obesity complicating pregnancy, second trimester: Secondary | ICD-10-CM | POA: Diagnosis not present

## 2023-11-29 DIAGNOSIS — Z3480 Encounter for supervision of other normal pregnancy, unspecified trimester: Secondary | ICD-10-CM

## 2023-11-29 DIAGNOSIS — Z3A19 19 weeks gestation of pregnancy: Secondary | ICD-10-CM

## 2023-11-29 DIAGNOSIS — Z3A21 21 weeks gestation of pregnancy: Secondary | ICD-10-CM | POA: Diagnosis not present

## 2023-12-07 ENCOUNTER — Other Ambulatory Visit: Payer: Self-pay | Admitting: Certified Nurse Midwife

## 2023-12-07 DIAGNOSIS — O212 Late vomiting of pregnancy: Secondary | ICD-10-CM

## 2023-12-09 ENCOUNTER — Ambulatory Visit (INDEPENDENT_AMBULATORY_CARE_PROVIDER_SITE_OTHER): Payer: Medicaid Other | Admitting: Obstetrics

## 2023-12-09 ENCOUNTER — Encounter: Payer: Self-pay | Admitting: Obstetrics

## 2023-12-09 VITALS — BP 104/69 | HR 84 | Wt 222.0 lb

## 2023-12-09 DIAGNOSIS — Z3480 Encounter for supervision of other normal pregnancy, unspecified trimester: Secondary | ICD-10-CM | POA: Diagnosis not present

## 2023-12-09 NOTE — Assessment & Plan Note (Signed)
-  Discussed 28-week labs at next visit. Alternate glucose handout given. -Reviewed comfort measures for congestion -Recommend protein shakes, frequent small snacks for poor appetite

## 2023-12-09 NOTE — Progress Notes (Signed)
    Return Prenatal Note   Assessment/Plan   Plan  38 y.o. [id number] at [redacted]w[redacted]d presents for follow-up OB visit. Reviewed prenatal record including previous visit note.  Supervision of other normal pregnancy, antepartum -Discussed 28-week labs at next visit. Alternate glucose handout given. -Reviewed comfort measures for congestion -Recommend protein shakes, frequent small snacks for poor appetite   No orders of the defined types were placed in this encounter.  Return in about 4 weeks (around 01/06/2024).   Future Appointments  Date Time Provider Department Center  12/27/2023  3:00 PM [facility] US  1 [facility]OB-[facility] None    For next visit:  ROB with 28-week labs and TDaP    Subjective   Yola is congested; multiple family members are sick. She also continues to have a poor appetite and is not eating much.  Movement: Present Contractions: Not present  Objective   Flow sheet Vitals: Pulse Rate: 84 BP: 104/69 Fundal Height: 25 cm Fetal Heart Rate (bpm): 151 Total weight gain: 12 lb (5.443 kg)  General Appearance  No acute distress, well appearing, and well nourished Pulmonary   Normal work of breathing Neurologic   Alert and oriented to person, place, and time Psychiatric   Mood and affect within normal limits  Eleanor Canny, CNM 12/09/23 11:49 AM

## 2023-12-26 ENCOUNTER — Other Ambulatory Visit: Payer: Self-pay | Admitting: Certified Nurse Midwife

## 2023-12-26 DIAGNOSIS — Z362 Encounter for other antenatal screening follow-up: Secondary | ICD-10-CM

## 2023-12-26 DIAGNOSIS — O212 Late vomiting of pregnancy: Secondary | ICD-10-CM

## 2023-12-27 ENCOUNTER — Ambulatory Visit: Payer: Medicaid Other

## 2023-12-27 ENCOUNTER — Other Ambulatory Visit: Payer: Self-pay | Admitting: Certified Nurse Midwife

## 2023-12-27 DIAGNOSIS — Z362 Encounter for other antenatal screening follow-up: Secondary | ICD-10-CM | POA: Diagnosis not present

## 2023-12-27 DIAGNOSIS — O99212 Obesity complicating pregnancy, second trimester: Secondary | ICD-10-CM

## 2023-12-27 DIAGNOSIS — Z3A25 25 weeks gestation of pregnancy: Secondary | ICD-10-CM

## 2023-12-27 DIAGNOSIS — E669 Obesity, unspecified: Secondary | ICD-10-CM

## 2023-12-27 DIAGNOSIS — O212 Late vomiting of pregnancy: Secondary | ICD-10-CM

## 2024-01-02 ENCOUNTER — Other Ambulatory Visit: Payer: Self-pay

## 2024-01-02 DIAGNOSIS — Z131 Encounter for screening for diabetes mellitus: Secondary | ICD-10-CM

## 2024-01-02 DIAGNOSIS — Z113 Encounter for screening for infections with a predominantly sexual mode of transmission: Secondary | ICD-10-CM

## 2024-01-02 DIAGNOSIS — D649 Anemia, unspecified: Secondary | ICD-10-CM

## 2024-01-03 ENCOUNTER — Other Ambulatory Visit: Payer: Medicaid Other

## 2024-01-03 ENCOUNTER — Ambulatory Visit (INDEPENDENT_AMBULATORY_CARE_PROVIDER_SITE_OTHER): Payer: Medicaid Other | Admitting: Obstetrics

## 2024-01-03 VITALS — BP 106/55 | HR 93 | Wt 229.0 lb

## 2024-01-03 DIAGNOSIS — Z3A26 26 weeks gestation of pregnancy: Secondary | ICD-10-CM

## 2024-01-03 DIAGNOSIS — O99210 Obesity complicating pregnancy, unspecified trimester: Secondary | ICD-10-CM | POA: Insufficient documentation

## 2024-01-03 DIAGNOSIS — O09522 Supervision of elderly multigravida, second trimester: Secondary | ICD-10-CM | POA: Insufficient documentation

## 2024-01-03 DIAGNOSIS — O09523 Supervision of elderly multigravida, third trimester: Secondary | ICD-10-CM | POA: Insufficient documentation

## 2024-01-03 DIAGNOSIS — O99212 Obesity complicating pregnancy, second trimester: Secondary | ICD-10-CM

## 2024-01-03 DIAGNOSIS — O99213 Obesity complicating pregnancy, third trimester: Secondary | ICD-10-CM | POA: Insufficient documentation

## 2024-01-03 DIAGNOSIS — O99891 Other specified diseases and conditions complicating pregnancy: Secondary | ICD-10-CM | POA: Diagnosis not present

## 2024-01-03 DIAGNOSIS — M7918 Myalgia, other site: Secondary | ICD-10-CM

## 2024-01-03 DIAGNOSIS — O34219 Maternal care for unspecified type scar from previous cesarean delivery: Secondary | ICD-10-CM | POA: Insufficient documentation

## 2024-01-03 DIAGNOSIS — E669 Obesity, unspecified: Secondary | ICD-10-CM

## 2024-01-03 NOTE — Patient Instructions (Signed)
 Glucose Tolerance Test Why am I having this test? The glucose tolerance test (GTT) is done to check how your body processes sugar (glucose). This is one of several tests used to diagnose diabetes (diabetes mellitus). Your health care provider may recommend this test if you: Have a family history of diabetes. Are obese. Have infections that keep coming back. Have had a lot of wounds that did not heal quickly, especially on your legs and feet. Are a woman and have a history of giving birth to very large babies or a history of repeated fetal loss (stillbirth). Have had high glucose levels in your urine or blood: During a past pregnancy. After a heart attack, surgery, or prolonged periods of high stress. What is being tested? This test measures the amount of glucose in your blood at different times during a period of 2 hours. This indicates how well your body is able to process glucose. What kind of sample is taken?  Blood samples are required for this test. They are usually collected by inserting a needle into a blood vessel. How do I prepare for this test? For 3 days before your test, eat normally. Make sure to eat enough carbohydrates (at least 150 grams per day). Follow instructions from your health care provider about: Eating or drinking restrictions on the day of the test. You may be asked to not eat or drink anything other than water (fast) starting 8-12 hours before the test. Changing or stopping your regular medicines. Some medicines may interfere with this test. Tell a health care provider about: All medicines you are taking, including vitamins, herbs, eye drops, creams, and over-the-counter medicines. Any blood disorders you have. Any surgeries you have had. Any medical conditions you have. Whether you are pregnant or may be pregnant. What happens during the test? First, your blood glucose will be measured. This is your fasting blood glucose since you fasted before the test. Then,  you will drink a glucose solution containing a specific amount of glucose. Your blood glucose will be measured again 1 and 2 hours after drinking the solution. This test takes 2 hours to complete. You will need to stay at the testing location during this time. During the testing period: Do not eat or drink anything other than the glucose solution. You will be allowed to drink water. Do not exercise. Do not use any products that contain nicotine or tobacco. These products include cigarettes, chewing tobacco, and vaping devices, such as e-cigarettes. If you need help quitting, ask your health care provider. The testing procedure may vary among health care providers and hospitals. How are the results reported? Your test results will be reported as values. These will be given as milligrams of glucose per deciliter of blood (mg/dL) or millimoles per liter (mmol/L). Your health care provider will compare your results to normal ranges that were established after testing a large group of people (reference ranges). Reference ranges may vary among labs and hospitals. For this test, common reference ranges are: Fasting: less than 110 mg/dL (6.1 mmol/L). 1 hour after drinking glucose: less than 180 mg/dL (16.1 mmol/L). 2 hours after drinking glucose: less than 140 mg/dL (7.8 mmol/L). What do the results mean? Results within the reference ranges are considered normal, meaning your glucose levels are normal. Results higher than the reference ranges may indicate you have diabetes. Talk with your health care provider about what your results mean. Questions to ask your health care provider Ask your health care provider, or the department that  is doing the test: When will my results be ready? How will I get my results? What are my treatment options? What other tests do I need? What are my next steps? Summary The GTT is done to check how your body processes sugar (glucose). This is one of several tests used to  diagnose diabetes. This test measures the amount of glucose in your blood at different times during a period of 2 hours. This indicates how well your body is able to process glucose. Talk with your health care provider about what your results mean. This information is not intended to replace advice given to you by your health care provider. Make sure you discuss any questions you have with your health care provider. Document Revised: 05/25/2022 Document Reviewed: 12/02/2021 Elsevier Patient Education  2024 [employer].

## 2024-01-03 NOTE — Progress Notes (Signed)
    Return Prenatal Note   Subjective  38 y.o. [id number] at [redacted]w[redacted]d presents for this follow-up prenatal visit. Pregnancy notable for AMA and BMI.   Patient doing 1hr glucose today. Pt has a left-sided chest pain and it was bad last night, she states she has had it the whole pregnancy and no one has addressed it. Is on her upper right chest wall, intermittent, feels like a sharp pinching, and lasts a few seconds and self-resolves. Happens randomly, no triggers and nothing alleviates. Denies SOB, chest tightness, L-arm radiation with this pain.   Patient reports: Movement: Present Contractions: Not present Denies vaginal bleeding or leaking fluid. Objective  Flow sheet Vitals: Pulse Rate: 93 BP: (!) 106/55 Fundal Height: 27 cm Fetal Heart Rate (bpm): 142 Total weight gain: 19 lb (8.618 kg)  General Appearance  No acute distress, well appearing, and well nourished Cardiac   RRR, S1, S2, no appreciated murmur Pulmonary   Normal work of breathing, CTAB Musculoskeletal  Intercostal point tenderness between ribs 2-4, midclavicularly Neurologic   Alert and oriented to person, place, and time Psychiatric   Mood and affect within normal limits  Assessment/Plan   Plan  38 y.o. [id number] at [redacted]w[redacted]d by 12wk [place] presents for follow-up OB visit. Reviewed prenatal record including previous visit note. 1. Multigravida of advanced maternal age in second trimester (Primary) -NIPT negative, does not need antenatal testing -1hGTT and 28wk labs done today; discussed Tdap for next visit, pt desires  2. Obesity in pregnancy -BMI today 40.57 -Continue daily ASA, limit weight gain, daily physical activity -Q4wk growth [place] at 28wks and weekly NSTs at 34wks  3. Previous cesarean x 4  4. Intercostal muscle spasm -Pt's left chest wall pain is most likely musculoskeletal, not suspicious for cardiac origin. Discussed with patient and not many options to treat while pregnant. Can try warm compresses, massage,  stretches, and avoiding overuse/aggravation. We discussed si/sx concerning for actual cardiac chest pain and when to seek treatment in ED.    Orders Placed This Encounter  Procedures   US OB Follow Up    Standing Status:   Future    Expected Date:   01/17/2024    Expiration Date:   01/02/2025    Reason for exam::   Growth, BMI    Preferred imaging location?:   Internal   Return in about 2 weeks (around 01/17/2024) for ROB w/growth [place].   Future Appointments  Date Time Provider Department Center  01/17/2024 10:55 AM Doreene Burke, CNM [facility] None  01/17/2024 11:15 AM [facility] [place] 1 [facility] None    For next visit:  ROB & growth [place], Tdap    Julieanne Manson, DO [facility]

## 2024-01-04 ENCOUNTER — Encounter: Payer: Self-pay | Admitting: Obstetrics

## 2024-01-04 LAB — 28 WEEK RH+PANEL
Basophils Absolute: 0.1 10*3/uL (ref 0.0–0.2)
Basos: 1 %
EOS (ABSOLUTE): 0.3 10*3/uL (ref 0.0–0.4)
Eos: 3 %
Gestational Diabetes Screen: 134 mg/dL (ref 70–139)
HIV Screen 4th Generation wRfx: NONREACTIVE
Hematocrit: 37.1 % (ref 34.0–46.6)
Hemoglobin: 12.3 g/dL (ref 11.1–15.9)
Immature Grans (Abs): 0.1 10*3/uL (ref 0.0–0.1)
Immature Granulocytes: 1 %
Lymphocytes Absolute: 2.2 10*3/uL (ref 0.7–3.1)
Lymphs: 21 %
MCH: 30 pg (ref 26.6–33.0)
MCHC: 33.2 g/dL (ref 31.5–35.7)
MCV: 91 fL (ref 79–97)
Monocytes Absolute: 0.4 10*3/uL (ref 0.1–0.9)
Monocytes: 4 %
Neutrophils Absolute: 7.7 10*3/uL — ABNORMAL HIGH (ref 1.4–7.0)
Neutrophils: 70 %
Platelets: 216 10*3/uL (ref 150–450)
RBC: 4.1 x10E6/uL (ref 3.77–5.28)
RDW: 12.1 % (ref 11.7–15.4)
RPR Ser Ql: NONREACTIVE
WBC: 10.8 10*3/uL (ref 3.4–10.8)

## 2024-01-17 ENCOUNTER — Other Ambulatory Visit: Payer: Self-pay | Admitting: Obstetrics

## 2024-01-17 ENCOUNTER — Ambulatory Visit (INDEPENDENT_AMBULATORY_CARE_PROVIDER_SITE_OTHER): Admitting: Certified Nurse Midwife

## 2024-01-17 ENCOUNTER — Ambulatory Visit

## 2024-01-17 VITALS — BP 91/63 | HR 74 | Wt 231.0 lb

## 2024-01-17 DIAGNOSIS — Z3A29 29 weeks gestation of pregnancy: Secondary | ICD-10-CM

## 2024-01-17 DIAGNOSIS — O99210 Obesity complicating pregnancy, unspecified trimester: Secondary | ICD-10-CM

## 2024-01-17 DIAGNOSIS — Z3A28 28 weeks gestation of pregnancy: Secondary | ICD-10-CM

## 2024-01-17 DIAGNOSIS — M7918 Myalgia, other site: Secondary | ICD-10-CM

## 2024-01-17 DIAGNOSIS — O09523 Supervision of elderly multigravida, third trimester: Secondary | ICD-10-CM

## 2024-01-17 DIAGNOSIS — Z23 Encounter for immunization: Secondary | ICD-10-CM

## 2024-01-17 DIAGNOSIS — O99213 Obesity complicating pregnancy, third trimester: Secondary | ICD-10-CM

## 2024-01-17 DIAGNOSIS — O34219 Maternal care for unspecified type scar from previous cesarean delivery: Secondary | ICD-10-CM

## 2024-01-17 DIAGNOSIS — O09522 Supervision of elderly multigravida, second trimester: Secondary | ICD-10-CM

## 2024-01-17 NOTE — Patient Instructions (Signed)
 Third Trimester of Pregnancy  The third trimester of pregnancy is from week 28 through week 40. This is months 7 through 9. The third trimester is a time when your baby is growing fast. Body changes during your third trimester Your body continues to change during this time. The changes usually go away after your baby is born. Physical changes You will continue to gain weight. You may get stretch marks on your hips, belly, and breasts. Your breasts will keep growing and may hurt. A yellow fluid (colostrum) may leak from your breasts. This is the first milk you're making for your baby. Your hair may grow faster and get thicker. In some cases, you may get hair loss. Your belly button may stick out. You may have more swelling in your hands, face, or ankles. Health changes You may have heartburn. You may feel short of breath. This is caused by the uterus that is now bigger. You may have more aches in the pelvis, back, or thighs. You may have more tingling or numbness in your hands, arms, and legs. You may pee more often. You may have trouble pooping (constipation) or swollen veins in the butt that can itch or get painful (hemorrhoids). Other changes You may have more problems sleeping. You may notice the baby moving lower in your belly (dropping). You may have more fluid coming from your vagina. Your joints may feel loose, and you may have pain around your pelvic bone. Follow these instructions at home: Medicines Take medicines only as told by your health care provider. Some medicines are not safe during pregnancy. Your provider may change the medicines that you take. Do not take any medicines unless told to by your provider. Take a prenatal vitamin that has at least 600 micrograms (mcg) of folic acid. Do not use herbal medicines, illegal drugs, or medicines that are not approved by your provider. Eating and drinking While you're pregnant your body needs additional nutrition to help  support your growing baby. Talk with your provider about your nutritional needs. Activity Most women are able to exercise regularly during pregnancy. Exercise routines may need to change at the end of your pregnancy. Talk to your provider about your activities and exercise routine. Relieving pain and discomfort Rest often with your legs raised if you have leg cramps or low back pain. Take warm sitz baths to soothe pain from hemorrhoids. Use hemorrhoid cream if your provider says it's okay. Wear a good, supportive bra if your breasts hurt. Do not use hot tubs, steam rooms, or saunas. Do not douche. Do not use tampons or scented pads. Safety Talk to your provider before traveling far distances. Wear your seatbelt at all times when you're in a car. Talk to your provider if someone hits you, hurts you, or yells at you. Preparing for birth To prepare for your baby: Take childbirth and breastfeeding classes. Visit the hospital and tour the maternity area. Buy a rear-facing car seat. Learn how to install it in your car. General instructions Avoid cat litter boxes and soil used by cats. These things carry germs that can cause harm to your pregnancy and your baby. Do not drink alcohol, smoke, vape, or use products with nicotine or tobacco in them. If you need help quitting, talk with your provider. Keep all follow-up visits for your third trimester. Your provider will do more exams and tests during this trimester. Write down your questions. Take them to your prenatal visits. Your provider also will: Talk with you about  your overall health. Give you advice or refer you to specialists who can help with different needs, including: Mental health and counseling. Foods and healthy eating. Ask for help if you need help with food. Where to find more information American Pregnancy Association: americanpregnancy.org [employer]st[employer]ians and Gynecologists: acog.org Office on [employer] Health:  TravelLesson.ca Contact a health care provider if: You have a headache that does not go away when you take medicine. You have any of these problems: You can't eat or drink. You have nausea and vomiting. You have watery poop (diarrhea) for 2 days or more. You have pain when you pee, or your pee smells bad. You have been sick for 2 days or more and aren't getting better. Contact your provider right away if: You have any of these coming from your vagina: Abnormal discharge. Bad-smelling fluid. Bleeding. Your baby is moving less than usual. You have signs of labor: You have any contractions, belly cramping, or have pain in your pelvis or lower back before 37 weeks of pregnancy (preterm labor). You have regular contractions that are less than 5 minutes apart. Your water breaks. You have symptoms of high blood pressure or preeclampsia. These include: A severe, throbbing headache that does not go away. Sudden or extreme swelling of your face, hands, legs, or feet. Vision problems: You see spots. You have blurry vision. Your eyes are sensitive to light. If you can't reach your provider, go to an urgent care or emergency room. Get help right away if: You faint, become confused, or can't think clearly. You have chest pain or trouble breathing. You have any kind of injury, such as from a fall or a car crash. These symptoms may be an emergency. Call 911 right away. Do not wait to see if the symptoms will go away. Do not drive yourself to the hospital. This information is not intended to replace advice given to you by your health care provider. Make sure you discuss any questions you have with your health care provider. Document Revised: 07/21/2023 Document Reviewed: 02/18/2023 Elsevier Patient Education  2024 [employer].  Tdap (Tetanus, Diphtheria, Pertussis) Vaccine: What You Need to Know Many vaccine information statements are available in Spanish and other languages. See  PromoAge.com.br. 1. Why get vaccinated? Tdap vaccine can prevent tetanus, diphtheria, and pertussis. Diphtheria and pertussis spread from person to person. Tetanus enters the body through cuts or wounds. TETANUS (T) causes painful stiffening of the muscles. Tetanus can lead to serious health problems, including being unable to open the mouth, having trouble swallowing and breathing, or death. DIPHTHERIA (D) can lead to difficulty breathing, heart failure, paralysis, or death. PERTUSSIS (aP), also known as "whooping cough," can cause uncontrollable, violent coughing that makes it hard to breathe, eat, or drink. Pertussis can be extremely serious especially in babies and young children, causing pneumonia, convulsions, brain damage, or death. In teens and adults, it can cause weight loss, loss of bladder control, passing out, and rib fractures from severe coughing. 2. Tdap vaccine Tdap is only for children 7 years and older, adolescents, and adults.  Adolescents should receive a single dose of Tdap, preferably at age 23 or 12 years. Pregnant people should get a dose of Tdap during every pregnancy, preferably during the early part of the third trimester, to help protect the newborn from pertussis. Infants are most at risk for severe, life-threatening complications from pertussis. Adults who have never received Tdap should get a dose of Tdap. Also, adults should receive a booster  dose of either Tdap or Td (a different vaccine that protects against tetanus and diphtheria but not pertussis) every 10 years, or after 5 years in the case of a severe or dirty wound or burn. Tdap may be given at the same time as other vaccines. 3. Talk with your health care provider Tell your vaccine provider if the person getting the vaccine: Has had an allergic reaction after a previous dose of any vaccine that protects against tetanus, diphtheria, or pertussis, or has any severe, life-threatening allergies Has had a  coma, decreased level of consciousness, or prolonged seizures within 7 days after a previous dose of any pertussis vaccine (DTP, DTaP, or Tdap) Has seizures or another nervous system problem Has ever had Guillain-Barr Syndrome (also called "GBS") Has had severe pain or swelling after a previous dose of any vaccine that protects against tetanus or diphtheria In some cases, your health care provider may decide to postpone Tdap vaccination until a future visit. People with minor illnesses, such as a cold, may be vaccinated. People who are moderately or severely ill should usually wait until they recover before getting Tdap vaccine.  Your health care provider can give you more information. 4. Risks of a vaccine reaction Pain, redness, or swelling where the shot was given, mild fever, headache, feeling tired, and nausea, vomiting, diarrhea, or stomachache sometimes happen after Tdap vaccination. People sometimes faint after medical procedures, including vaccination. Tell your provider if you feel dizzy or have vision changes or ringing in the ears.  As with any medicine, there is a very remote chance of a vaccine causing a severe allergic reaction, other serious injury, or death. 5. What if there is a serious problem? An allergic reaction could occur after the vaccinated person leaves the clinic. If you see signs of a severe allergic reaction (hives, swelling of the face and throat, difficulty breathing, a fast heartbeat, dizziness, or weakness), call 9-1-1 and get the person to the nearest hospital. For other signs that concern you, call your health care provider.  Adverse reactions should be reported to the Vaccine Adverse Event Reporting System (VAERS). Your health care provider will usually file this report, or you can do it yourself. Visit the VAERS website at www.vaers.LAgents.no or call 937-175-7282. VAERS is only for reporting reactions, and VAERS staff members do not give medical advice. 6. The  National Vaccine Injury Compensation Program The [employer] Vaccine Injury Compensation Program (VICP) is a federal program that was created to compensate people who may have been injured by certain vaccines. Claims regarding alleged injury or death due to vaccination have a time limit for filing, which may be as short as two years. Visit the VICP website at SpiritualWord.at or call (925)586-3174 to learn about the program and about filing a claim. 7. How can I learn more? Ask your health care provider. Call your local or state health department. Visit the website of the Food and Drug Administration (FDA) for vaccine package inserts and additional information at FinderList.no. Contact the Centers for Disease Control and Prevention (CDC): Call 9790839027 (1-800-CDC-INFO) or Visit CDC's website at PicCapture.uy. Source: CDC Vaccine Information Statement Tdap (Tetanus, Diphtheria, Pertussis) Vaccine (06/06/2020) This same material is available at FootballExhibition.com.br for no charge. This information is not intended to replace advice given to you by your health care provider. Make sure you discuss any questions you have with your health care provider. Document Revised: 02/02/2023 Document Reviewed: 12/03/2022 Elsevier Patient Education  2024 [employer].

## 2024-01-17 NOTE — Progress Notes (Signed)
 ROB doing well. Feels good movement. 28 wk labs today: Glucose screen/RPR/CBC done3/4 . Tdap done 3/4, Blood transfusion consent completed, all questions answered. . Sample birth plan given, will follow up in upcoming visits. Discussed birth control after delivery, plans nexplanon.  Follow up 2 w for ROB or sooner if needed.     Gabriella Pennington, CNM Patient Name: Gabriella Pennington DOB: August 09, 1986 MRN: [id number]    ULTRASOUND REPORT  Location: [facility] Date of Service: 01/17/2024   Indications:maternal morbid obesity  Findings:  Gabriella Pennington intrauterine pregnancy is visualized with FHR at 124 bpm.   Gabriella Pennington an (U/S) Gestational age of [redacted]w[redacted]d and an (U/S) EDD of 04/03/2024; this correlates with the clinically established Estimated Date of Delivery: 04/06/24.   Fetal presentation is Cephalic.  Placenta: anterior.  Normal AFI: 15.6 cm, MVP 5.2 cm  Growth is in the  37th percentile   AC is in the 37th percentile. EFW: 1250g, 2lb 12oz  Impression: 1. [redacted]w[redacted]d Viable Single Intrauterine pregnancy dated by previously established criteria. 2. Growth is 37th percentile. Normal AFI is 15.6 cm.   Recommendations: 1.Clinical correlation with the patient's History and Physical Exam.   Gabriella Pennington, RDMS

## 2024-02-03 ENCOUNTER — Ambulatory Visit (INDEPENDENT_AMBULATORY_CARE_PROVIDER_SITE_OTHER): Admitting: Certified Nurse Midwife

## 2024-02-03 ENCOUNTER — Other Ambulatory Visit: Payer: Self-pay | Admitting: Certified Nurse Midwife

## 2024-02-03 VITALS — BP 123/75 | HR 73 | Wt 228.3 lb

## 2024-02-03 DIAGNOSIS — O99210 Obesity complicating pregnancy, unspecified trimester: Secondary | ICD-10-CM

## 2024-02-03 DIAGNOSIS — O219 Vomiting of pregnancy, unspecified: Secondary | ICD-10-CM

## 2024-02-03 DIAGNOSIS — K5900 Constipation, unspecified: Secondary | ICD-10-CM

## 2024-02-03 DIAGNOSIS — O26893 Other specified pregnancy related conditions, third trimester: Secondary | ICD-10-CM

## 2024-02-03 DIAGNOSIS — O34219 Maternal care for unspecified type scar from previous cesarean delivery: Secondary | ICD-10-CM

## 2024-02-03 DIAGNOSIS — Z3480 Encounter for supervision of other normal pregnancy, unspecified trimester: Secondary | ICD-10-CM

## 2024-02-03 DIAGNOSIS — Z3A31 31 weeks gestation of pregnancy: Secondary | ICD-10-CM | POA: Diagnosis not present

## 2024-02-03 MED ORDER — PANTOPRAZOLE SODIUM 40 MG PO PACK: 20.0000 mg | PACK | Freq: Every day | ORAL | 2 refills | Status: DC

## 2024-02-03 MED ORDER — DOCUSATE SODIUM 50 MG/5ML PO LIQD: 100.0000 mg | Freq: Two times a day (BID) | ORAL | 1 refills | Status: AC

## 2024-02-03 MED ORDER — POLYETHYLENE GLYCOL 3350 17 G PO PACK: 17.0000 g | PACK | Freq: Every day | ORAL | 11 refills | Status: DC

## 2024-02-03 MED ORDER — ONDANSETRON 4 MG PO TBDP: 4.0000 mg | ORAL_TABLET | Freq: Three times a day (TID) | ORAL | 1 refills | Status: DC | PRN

## 2024-02-03 NOTE — Assessment & Plan Note (Signed)
 Plan rLTCS at 39w, desires ON-Q pump for pain management. Recommend next visit with Dr. Lonny Prude for delivery planning as she does place ON-Q.

## 2024-02-03 NOTE — Addendum Note (Signed)
 Addended by: Hartley Barefoot on: 02/03/2024 01:14 PM   Modules accepted: Orders

## 2024-02-03 NOTE — Progress Notes (Signed)
    Return Prenatal Note   Subjective   38 y.o. [id number] at [redacted]w[redacted]d presents for this follow-up prenatal visit.  Patient feeling well, active baby. Desires to have ON-Q pump at delivery, this helped significantly with pain after her last birth. Plans Nexplanon for contraception Patient reports: Movement: Increased Contractions: Not present  Objective   Flow sheet Vitals: Pulse Rate: 73 BP: 123/75 Fundal Height: 31 cm Fetal Heart Rate (bpm): 135 Total weight gain: 18 lb 4.8 oz (8.301 kg)  General Appearance  No acute distress, well appearing, and well nourished Pulmonary   Normal work of breathing Neurologic   Alert and oriented to person, place, and time Psychiatric   Mood and affect within normal limits  Assessment/Plan   Plan  38 y.o. [id number] at [redacted]w[redacted]d presents for follow-up OB visit. Reviewed prenatal record including previous visit note.  Previous cesarean delivery, antepartum Plan rLTCS at 39w, desires ON-Q pump for pain management. Recommend next visit with Dr. Lonny Prude for delivery planning as she does place ON-Q.  Supervision of other normal pregnancy, antepartum Reviewed kick counts and preterm labor warning signs. Instructed to call office or come to hospital with persistent headache, vision changes, regular contractions, leaking of fluid, decreased fetal movement or vaginal bleeding. Ultrasound for growth ordered. To begin NSTs after 34w for obesity in pregnancy.      Orders Placed This Encounter  Procedures   US OB Follow Up    Standing Status:   Future    Expected Date:   02/16/2024    Expiration Date:   05/04/2024    Reason for exam::   growth, maternal obesity in pregnancy    Preferred imaging location?:   Internal   Return in 2 weeks (on 02/17/2024) for ROB & growth ultrasound.   Future Appointments  Date Time Provider Department Center  02/13/2024  4:00 PM [facility]  02/16/2024  3:35 PM Julieanne Manson, MD [facility] None    For next visit:   continue with routine prenatal care     Dominica Severin, CNM  02/02/2510:46 AM

## 2024-02-03 NOTE — Assessment & Plan Note (Signed)
 Reviewed kick counts and preterm labor warning signs. Instructed to call office or come to hospital with persistent headache, vision changes, regular contractions, leaking of fluid, decreased fetal movement or vaginal bleeding. Ultrasound for growth ordered. To begin NSTs after 34w for obesity in pregnancy.

## 2024-02-03 NOTE — Patient Instructions (Signed)
 Third Trimester of Pregnancy  The third trimester of pregnancy is from week 28 through week 40. This is months 7 through 9. The third trimester is a time when your baby is growing fast. Body changes during your third trimester Your body continues to change during this time. The changes usually go away after your baby is born. Physical changes You will continue to gain weight. You may get stretch marks on your hips, belly, and breasts. Your breasts will keep growing and may hurt. A yellow fluid (colostrum) may leak from your breasts. This is the first milk you're making for your baby. Your hair may grow faster and get thicker. In some cases, you may get hair loss. Your belly button may stick out. You may have more swelling in your hands, face, or ankles. Health changes You may have heartburn. You may feel short of breath. This is caused by the uterus that is now bigger. You may have more aches in the pelvis, back, or thighs. You may have more tingling or numbness in your hands, arms, and legs. You may pee more often. You may have trouble pooping (constipation) or swollen veins in the butt that can itch or get painful (hemorrhoids). Other changes You may have more problems sleeping. You may notice the baby moving lower in your belly (dropping). You may have more fluid coming from your vagina. Your joints may feel loose, and you may have pain around your pelvic bone. Follow these instructions at home: Medicines Take medicines only as told by your health care provider. Some medicines are not safe during pregnancy. Your provider may change the medicines that you take. Do not take any medicines unless told to by your provider. Take a prenatal vitamin that has at least 600 micrograms (mcg) of folic acid. Do not use herbal medicines, illegal drugs, or medicines that are not approved by your provider. Eating and drinking While you're pregnant your body needs additional nutrition to help  support your growing baby. Talk with your provider about your nutritional needs. Activity Most women are able to exercise regularly during pregnancy. Exercise routines may need to change at the end of your pregnancy. Talk to your provider about your activities and exercise routine. Relieving pain and discomfort Rest often with your legs raised if you have leg cramps or low back pain. Take warm sitz baths to soothe pain from hemorrhoids. Use hemorrhoid cream if your provider says it's okay. Wear a good, supportive bra if your breasts hurt. Do not use hot tubs, steam rooms, or saunas. Do not douche. Do not use tampons or scented pads. Safety Talk to your provider before traveling far distances. Wear your seatbelt at all times when you're in a car. Talk to your provider if someone hits you, hurts you, or yells at you. Preparing for birth To prepare for your baby: Take childbirth and breastfeeding classes. Visit the hospital and tour the maternity area. Buy a rear-facing car seat. Learn how to install it in your car. General instructions Avoid cat litter boxes and soil used by cats. These things carry germs that can cause harm to your pregnancy and your baby. Do not drink alcohol, smoke, vape, or use products with nicotine or tobacco in them. If you need help quitting, talk with your provider. Keep all follow-up visits for your third trimester. Your provider will do more exams and tests during this trimester. Write down your questions. Take them to your prenatal visits. Your provider also will: Talk with you about  your overall health. Give you advice or refer you to specialists who can help with different needs, including: Mental health and counseling. Foods and healthy eating. Ask for help if you need help with food. Where to find more information American Pregnancy Association: americanpregnancy.org [employer]stetricians and Gynecologists: acog.org Office on [employer] Health:  TravelLesson.ca Contact a health care provider if: You have a headache that does not go away when you take medicine. You have any of these problems: You can't eat or drink. You have nausea and vomiting. You have watery poop (diarrhea) for 2 days or more. You have pain when you pee, or your pee smells bad. You have been sick for 2 days or more and aren't getting better. Contact your provider right away if: You have any of these coming from your vagina: Abnormal discharge. Bad-smelling fluid. Bleeding. Your baby is moving less than usual. You have signs of labor: You have any contractions, belly cramping, or have pain in your pelvis or lower back before 37 weeks of pregnancy (preterm labor). You have regular contractions that are less than 5 minutes apart. Your water breaks. You have symptoms of high blood pressure or preeclampsia. These include: A severe, throbbing headache that does not go away. Sudden or extreme swelling of your face, hands, legs, or feet. Vision problems: You see spots. You have blurry vision. Your eyes are sensitive to light. If you can't reach your provider, go to an urgent care or emergency room. Get help right away if: You faint, become confused, or can't think clearly. You have chest pain or trouble breathing. You have any kind of injury, such as from a fall or a car crash. These symptoms may be an emergency. Call 911 right away. Do not wait to see if the symptoms will go away. Do not drive yourself to the hospital. This information is not intended to replace advice given to you by your health care provider. Make sure you discuss any questions you have with your health care provider. Document Revised: 07/21/2023 Document Reviewed: 02/18/2023 Elsevier Patient Education  2024 [employer].

## 2024-02-06 ENCOUNTER — Telehealth: Payer: Self-pay

## 2024-02-07 ENCOUNTER — Other Ambulatory Visit: Payer: Self-pay | Admitting: Certified Nurse Midwife

## 2024-02-07 ENCOUNTER — Encounter: Payer: Self-pay | Admitting: Certified Nurse Midwife

## 2024-02-07 MED ORDER — OMEPRAZOLE 2 MG/ML ORAL SUSPENSION
20.0000 mg | Freq: Two times a day (BID) | ORAL | 3 refills | Status: DC
Start: 2024-02-07 — End: 2024-03-07

## 2024-02-07 NOTE — Telephone Encounter (Addendum)
 Marland Kitchen

## 2024-02-13 ENCOUNTER — Ambulatory Visit
Admission: RE | Admit: 2024-02-13 | Discharge: 2024-02-13 | Disposition: A | Discharge status: Home / Self Care | Source: Ambulatory Visit | Attending: Certified Nurse Midwife | Admitting: Certified Nurse Midwife

## 2024-02-13 DIAGNOSIS — O99210 Obesity complicating pregnancy, unspecified trimester: Secondary | ICD-10-CM | POA: Insufficient documentation

## 2024-02-13 DIAGNOSIS — Z364 Encounter for antenatal screening for fetal growth retardation: Secondary | ICD-10-CM | POA: Insufficient documentation

## 2024-02-13 DIAGNOSIS — Z3480 Encounter for supervision of other normal pregnancy, unspecified trimester: Secondary | ICD-10-CM | POA: Insufficient documentation

## 2024-02-13 DIAGNOSIS — Z3A33 33 weeks gestation of pregnancy: Secondary | ICD-10-CM | POA: Diagnosis not present

## 2024-02-16 ENCOUNTER — Ambulatory Visit (INDEPENDENT_AMBULATORY_CARE_PROVIDER_SITE_OTHER): Admitting: Obstetrics

## 2024-02-16 VITALS — BP 101/61 | HR 93 | Wt 232.0 lb

## 2024-02-16 DIAGNOSIS — O09523 Supervision of elderly multigravida, third trimester: Secondary | ICD-10-CM

## 2024-02-16 DIAGNOSIS — E669 Obesity, unspecified: Secondary | ICD-10-CM | POA: Diagnosis not present

## 2024-02-16 DIAGNOSIS — O99210 Obesity complicating pregnancy, unspecified trimester: Secondary | ICD-10-CM

## 2024-02-16 DIAGNOSIS — O34219 Maternal care for unspecified type scar from previous cesarean delivery: Secondary | ICD-10-CM

## 2024-02-16 DIAGNOSIS — O99213 Obesity complicating pregnancy, third trimester: Secondary | ICD-10-CM | POA: Diagnosis not present

## 2024-02-16 DIAGNOSIS — Z3A32 32 weeks gestation of pregnancy: Secondary | ICD-10-CM

## 2024-02-16 NOTE — Progress Notes (Signed)
    Return Prenatal Note   Subjective  38 y.o. [id number] at [redacted]w[redacted]d presents for this follow-up prenatal visit. Pregnancy notable for AMA, BMI, prior CD x 4.   Patient hurting all over, she thinks she has done too much at work. Wanting to discuss planning for rCD. Does want the ON-Q pump, had prior and liked. Last growth on 4/14, EFW 66%ile.   Patient reports: Movement: Present Contractions: Not present Denies vaginal bleeding or leaking fluid. Objective  Flow sheet Vitals: Pulse Rate: 93 BP: 101/61 Fundal Height: 33 cm Fetal Heart Rate (bpm): 147 Total weight gain: 22 lb (9.979 kg)  General Appearance  No acute distress, well appearing, and well nourished Pulmonary   Normal work of breathing Neurologic   Alert and oriented to person, place, and time Psychiatric   Mood and affect within normal limits  Assessment/Plan   Plan  38 y.o. A5W0981 at [redacted]w[redacted]d by 12 wk US  presents for follow-up OB visit. Reviewed prenatal record including previous visit note.  1. Previous cesarean delivery x 4 -Pt scheduled for 5/30 at [redacted]w[redacted]d; wants ON-Q  2. AMA (advanced maternal age) multigravida 35+, third trimester  3. Obesity in pregnancy -BMI 41 -Growth Q4wks -Weekly NSTs at 34wks  Return in about 2 weeks (around 03/01/2024) for rob.   Future Appointments  Date Time Provider Department Center  03/01/2024  1:55 PM [name], MD [facility] None    For next visit:  continue with routine prenatal care   [name], DO [facility]

## 2024-02-23 ENCOUNTER — Other Ambulatory Visit: Payer: Self-pay

## 2024-02-23 DIAGNOSIS — O99210 Obesity complicating pregnancy, unspecified trimester: Secondary | ICD-10-CM

## 2024-02-23 DIAGNOSIS — O09523 Supervision of elderly multigravida, third trimester: Secondary | ICD-10-CM

## 2024-03-01 ENCOUNTER — Ambulatory Visit: Admitting: Obstetrics

## 2024-03-01 VITALS — BP 117/61 | HR 78 | Wt 227.0 lb

## 2024-03-01 DIAGNOSIS — O09523 Supervision of elderly multigravida, third trimester: Secondary | ICD-10-CM | POA: Diagnosis not present

## 2024-03-01 DIAGNOSIS — Z3A34 34 weeks gestation of pregnancy: Secondary | ICD-10-CM | POA: Insufficient documentation

## 2024-03-01 DIAGNOSIS — O34219 Maternal care for unspecified type scar from previous cesarean delivery: Secondary | ICD-10-CM

## 2024-03-01 DIAGNOSIS — E669 Obesity, unspecified: Secondary | ICD-10-CM

## 2024-03-01 DIAGNOSIS — O99213 Obesity complicating pregnancy, third trimester: Secondary | ICD-10-CM | POA: Diagnosis not present

## 2024-03-01 NOTE — Progress Notes (Signed)
    Return Prenatal Note   Subjective  38 y.o. [id number] at [redacted]w[redacted]d presents for this follow-up prenatal visit. Pregnancy notable for AMA, BMI, prior CD x 4.   Patient is well today, no acute concerns.   Patient reports: Movement: Present Contractions: Not present Denies vaginal bleeding or leaking fluid. Objective  Flow sheet Vitals: Pulse Rate: 78 BP: 117/61 Fundal Height: 35 cm Fetal Heart Rate (bpm): 125 Total weight gain: 17 lb (7.711 kg)  General Appearance  No acute distress, well appearing, and well nourished Pulmonary   Normal work of breathing Neurologic   Alert and oriented to person, place, and time Psychiatric   Mood and affect within normal limits  Fetus A Non-Stress Test Interpretation for 03/01/24 Indication:  Elevated BMI  Fetal Heart Rate A Mode: External Baseline Rate (A): 125 bpm Variability: Moderate Accelerations: 15 x 15 Decelerations: None Scalp Stimulation: Negative Multiple birth?: No  Uterine Activity Mode: Toco Contraction Frequency (min): None  Interpretation (Fetal Testing) Nonstress Test Interpretation: Reactive Overall Impression: Reassuring for gestational age    Assessment/Plan   Plan  38 y.o. [id number] at [redacted]w[redacted]d by 12wk US  presents for follow-up OB visit. Reviewed prenatal record including previous visit note.  1. Previous cesarean delivery, antepartum (Primary) - Pt scheduled for rCD 5/30 (39.0), ON-Q ordered   2. Obesity affecting pregnancy in third trimester, unspecified obesity type - BMI 40.2 today - Most recent growth 4/14 at [redacted]w[redacted]d: EFW 66%ile, AFI 12.6 - Next growth sched 5/16 at 36wks - Weekly NSTs until delivery   3. AMA (advanced maternal age) multigravida 35+, third trimester  Return in about 1 week (around 03/08/2024) for ROB w/NST.   Future Appointments  Date Time Provider Department Center  03/07/2024 10:15 AM AOB-NST ROOM [facility] None  03/07/2024 11:15 AM [place], Sherline Distel, CNM [facility] None  03/16/2024 11:00  AM [facility]  [facility]  [facility]-Outpati  03/23/2024  8:00 AM [facility] PAT[facility] None   For next visit:  continue with routine prenatal care   [name], DO [facility] [place]

## 2024-03-01 NOTE — Patient Instructions (Signed)

## 2024-03-01 NOTE — Progress Notes (Deleted)
mrr

## 2024-03-07 ENCOUNTER — Encounter: Payer: Self-pay | Admitting: Certified Nurse Midwife

## 2024-03-07 ENCOUNTER — Other Ambulatory Visit: Payer: Self-pay

## 2024-03-07 ENCOUNTER — Ambulatory Visit (INDEPENDENT_AMBULATORY_CARE_PROVIDER_SITE_OTHER): Admitting: Certified Nurse Midwife

## 2024-03-07 ENCOUNTER — Other Ambulatory Visit (HOSPITAL_COMMUNITY)
Admission: RE | Admit: 2024-03-07 | Discharge: 2024-03-07 | Disposition: A | Discharge status: Home / Self Care | Source: Ambulatory Visit | Attending: Certified Nurse Midwife | Admitting: Certified Nurse Midwife

## 2024-03-07 ENCOUNTER — Other Ambulatory Visit

## 2024-03-07 VITALS — BP 103/63 | HR 87 | Wt 233.9 lb

## 2024-03-07 DIAGNOSIS — Z3685 Encounter for antenatal screening for Streptococcus B: Secondary | ICD-10-CM

## 2024-03-07 DIAGNOSIS — Z3480 Encounter for supervision of other normal pregnancy, unspecified trimester: Secondary | ICD-10-CM | POA: Diagnosis present

## 2024-03-07 DIAGNOSIS — O34219 Maternal care for unspecified type scar from previous cesarean delivery: Secondary | ICD-10-CM

## 2024-03-07 DIAGNOSIS — E669 Obesity, unspecified: Secondary | ICD-10-CM | POA: Diagnosis not present

## 2024-03-07 DIAGNOSIS — Z3A36 36 weeks gestation of pregnancy: Secondary | ICD-10-CM | POA: Insufficient documentation

## 2024-03-07 DIAGNOSIS — Z113 Encounter for screening for infections with a predominantly sexual mode of transmission: Secondary | ICD-10-CM | POA: Diagnosis present

## 2024-03-07 DIAGNOSIS — O99213 Obesity complicating pregnancy, third trimester: Secondary | ICD-10-CM | POA: Diagnosis not present

## 2024-03-07 MED ORDER — OMEPRAZOLE MAGNESIUM 20 MG PO TBEC
20.0000 mg | DELAYED_RELEASE_TABLET | Freq: Every day | ORAL | 2 refills | Status: AC
Start: 2024-03-07 — End: ?

## 2024-03-07 NOTE — Assessment & Plan Note (Addendum)
 Having difficulty sleeping due to heart burn. Insurance does not cover liquid pepcid . Rx send for tabs.  Reviewed kick counts and preterm labor warning signs. Instructed to call office or come to hospital with persistent headache, vision changes, regular contractions, leaking of fluid, decreased fetal movement or vaginal bleeding.

## 2024-03-07 NOTE — Progress Notes (Signed)
 Adding BPP to growth scan of 5/16.

## 2024-03-07 NOTE — Patient Instructions (Signed)

## 2024-03-07 NOTE — Assessment & Plan Note (Signed)
 Has rC/D scheduled for 5/30

## 2024-03-07 NOTE — Progress Notes (Signed)
    Return Prenatal Note   Assessment/Plan   Plan  38 y.o. [id number] at [redacted]w[redacted]d presents for follow-up OB visit. Reviewed prenatal record including previous visit note.  Previous cesarean delivery, antepartum Has rC/D scheduled for 5/30  Obesity affecting pregnancy in third trimester Reactive NST today  Supervision of other normal pregnancy, antepartum Having difficulty sleeping due to heart burn. Insurance does not cover liquid pepcid . Rx send for tabs.  Reviewed kick counts and preterm labor warning signs. Instructed to call office or come to hospital with persistent headache, vision changes, regular contractions, leaking of fluid, decreased fetal movement or vaginal bleeding.    Orders Placed This Encounter  Procedures   Culture, beta strep (group b only)   No follow-ups on file.   Future Appointments  Date Time Provider Department Center  03/07/2024 11:15 AM Bryanah Sidell, Sherline Distel, CNM [facility] None  03/16/2024 11:00 AM [facility]-US  [facility]US  [facility]Outpati  03/23/2024  8:00 AM [facility] None    For next visit:  continue with routine prenatal care     Subjective   38 y.o. A5W0981 at [redacted]w[redacted]d presents for this follow-up prenatal visit.  Patient with increased heart burn at night Patient reports: Movement: Present Contractions: Not present  Objective   Flow sheet Vitals: Pulse Rate: 87 BP: 103/63 Total weight gain: 23 lb 14.4 oz (10.8 kg)  KOURTNI GREGOR Oct 27, 1986 [redacted]w[redacted]d  Fetus A Non-Stress Test Interpretation for 03/07/24  Indication:  obesity  Fetal Heart Rate A Mode: External Baseline Rate (A): 125 bpm Variability: Moderate Accelerations: 15 x 15 Decelerations: None Scalp Stimulation: Negative Multiple birth?: No  Uterine Activity Mode: Toco Contraction Frequency (min): none  Interpretation (Fetal Testing) Nonstress Test Interpretation: Reactive Overall Impression: Reassuring for gestational age   General Appearance  No acute distress, well  appearing, and well nourished Pulmonary   Normal work of breathing Neurologic   Alert and oriented to person, place, and time Psychiatric   Mood and affect within normal limits      Donato Fu, CNM  03/07/2509:52 AM

## 2024-03-07 NOTE — Assessment & Plan Note (Signed)
Reactive NST today 

## 2024-03-08 LAB — CERVICOVAGINAL ANCILLARY ONLY
Chlamydia: NEGATIVE
Comment: NEGATIVE
Comment: NORMAL
Neisseria Gonorrhea: NEGATIVE

## 2024-03-11 LAB — CULTURE, BETA STREP (GROUP B ONLY): Strep Gp B Culture: NEGATIVE

## 2024-03-16 ENCOUNTER — Ambulatory Visit: Admitting: Advanced Practice Midwife

## 2024-03-16 ENCOUNTER — Ambulatory Visit
Admission: RE | Admit: 2024-03-16 | Discharge: 2024-03-16 | Disposition: A | Discharge status: Home / Self Care | Source: Ambulatory Visit | Attending: Obstetrics | Admitting: Obstetrics

## 2024-03-16 ENCOUNTER — Encounter: Payer: Self-pay | Admitting: Advanced Practice Midwife

## 2024-03-16 VITALS — BP 101/66 | HR 93 | Wt 226.9 lb

## 2024-03-16 DIAGNOSIS — O99213 Obesity complicating pregnancy, third trimester: Secondary | ICD-10-CM | POA: Diagnosis present

## 2024-03-16 DIAGNOSIS — Z3A37 37 weeks gestation of pregnancy: Secondary | ICD-10-CM

## 2024-03-16 DIAGNOSIS — O09523 Supervision of elderly multigravida, third trimester: Secondary | ICD-10-CM | POA: Diagnosis present

## 2024-03-16 DIAGNOSIS — Z3483 Encounter for supervision of other normal pregnancy, third trimester: Secondary | ICD-10-CM

## 2024-03-16 DIAGNOSIS — O99210 Obesity complicating pregnancy, unspecified trimester: Secondary | ICD-10-CM | POA: Diagnosis present

## 2024-03-16 DIAGNOSIS — Z3480 Encounter for supervision of other normal pregnancy, unspecified trimester: Secondary | ICD-10-CM

## 2024-03-16 NOTE — Progress Notes (Signed)
 Routine Prenatal Care Visit  Subjective  Gabriella Pennington is a 38 y.o. K4M0102 at [redacted]w[redacted]d being seen today for ongoing prenatal care.  She is currently monitored for the following issues for this high-risk pregnancy and has Supervision of other normal pregnancy, antepartum; Obesity affecting pregnancy in third trimester; AMA (advanced maternal age) multigravida 35+, third trimester; and Previous cesarean delivery, antepartum on their problem list.  ----------------------------------------------------------------------------------- Patient reports she is doing well. Has c/s scheduled in 2 weeks. Reviewed results of u/s today. Will do NST next week.   Contractions: Not present. Vag. Bleeding: None.  Movement: Present. Leaking Fluid denies.  ----------------------------------------------------------------------------------- The following portions of the patient's history were reviewed and updated as appropriate: allergies, current medications, past family history, past medical history, past social history, past surgical history and problem list. Problem list updated.  Objective  Blood pressure 101/66, pulse 93, weight 226 lb 14.4 oz (102.9 kg), last menstrual period 07/05/2023. Pregravid weight 210 lb (95.3 kg) Total Weight Gain 16 lb 14.4 oz (7.666 kg) Urinalysis: Urine Protein    Urine Glucose    Fetal Status: Fetal Heart Rate (bpm): 130   Movement: Present  Presentation: Vertex (on u/s today)  [facility] today: 2951g, 6#5oz, 42%, AFI 11.2, cephalic  General:  Alert, oriented and cooperative. Patient is in no acute distress.  Skin: Skin is warm and dry. No rash noted.   Cardiovascular: Normal heart rate noted  Respiratory: Normal respiratory effort, no problems with respiration noted  Abdomen: Soft, gravid, appropriate for gestational age. Pain/Pressure: Present     Pelvic:  Cervical exam deferred        Extremities: Normal range of motion.  Edema: None  Mental Status: Normal mood and  affect. Normal behavior. Normal judgment and thought content.   Assessment   38 y.o. [id number] at [redacted]w[redacted]d by  04/06/2024, by Ultrasound presenting for routine prenatal visit  Plan   fifth Problems (from 09/23/23 to present)     Problem Noted Diagnosed Resolved   Supervision of other normal pregnancy, antepartum 10/10/2023 by Delta Fila, CMA  No   Overview Addendum 03/15/2024  4:13 PM by Lendon Queen, CMA   Clinical Staff Provider  Office Location  [facility] Dating  04/10/2024, by Last Menstrual Period  Language  English Anatomy US     Flu Vaccine  offer Genetic Screen  NIPS: NEG: Female  TDaP vaccine  01/17/2024 Hgb A1C or  GTT Early :134 Third trimester :   Covid declined   LAB RESULTS   Rhogam  O/Positive/-- (12/13 1402)  Blood Type O/Positive/-- (12/13 1402) O+  04/09/21  RSV offer Antibody Negative (12/13 1402)Neg  04/09/21  Feeding Plan formula Rubella 1.66 (12/13 1402)Immune  10/16/20  Contraception nexplanon RPR Non Reactive (03/04 1111) Non Reactive  04/08/21  Circumcision yes HBsAg Negative (12/13 1402) Neg  10/16/20  Pediatrician  [facility] HIV Non Reactive (03/04 1111)Non Reactive  04/08/21  Support Person Daughter, Emeterio Hansen Varicella Reactive (12/13 1402)  Prenatal Classes no GBS  (For PCN allergy, check sensitivities)     Hep C Non Reactive (12/13 1402)   BTL Consent  Pap Neg; HPV neg  10/03/20  VBAC Consent  Hgb Electro      CF   GC/CHLM NEG/NEG  09/23/23 SMA                    Term labor symptoms and general obstetric precautions including but not limited to vaginal bleeding, contractions, leaking of fluid and fetal movement  were reviewed in detail with the patient. Please refer to After Visit Summary for other counseling recommendations.   Return in about 1 week (around 03/23/2024) for rob with nst.  Angelita Kendall, CNM 03/16/2024 2:26 PM

## 2024-03-23 ENCOUNTER — Encounter
Admission: RE | Admit: 2024-03-23 | Discharge: 2024-03-23 | Disposition: A | Discharge status: Home / Self Care | Source: Ambulatory Visit | Attending: Obstetrics | Admitting: Obstetrics

## 2024-03-23 ENCOUNTER — Other Ambulatory Visit

## 2024-03-23 ENCOUNTER — Other Ambulatory Visit: Payer: Self-pay

## 2024-03-23 ENCOUNTER — Ambulatory Visit (INDEPENDENT_AMBULATORY_CARE_PROVIDER_SITE_OTHER): Admitting: Certified Nurse Midwife

## 2024-03-23 VITALS — BP 98/58 | HR 78 | Wt 230.8 lb

## 2024-03-23 DIAGNOSIS — E669 Obesity, unspecified: Secondary | ICD-10-CM

## 2024-03-23 DIAGNOSIS — O34219 Maternal care for unspecified type scar from previous cesarean delivery: Secondary | ICD-10-CM | POA: Diagnosis not present

## 2024-03-23 DIAGNOSIS — Z3A38 38 weeks gestation of pregnancy: Secondary | ICD-10-CM

## 2024-03-23 DIAGNOSIS — O99213 Obesity complicating pregnancy, third trimester: Secondary | ICD-10-CM | POA: Diagnosis not present

## 2024-03-23 DIAGNOSIS — O09523 Supervision of elderly multigravida, third trimester: Secondary | ICD-10-CM | POA: Diagnosis not present

## 2024-03-23 NOTE — Progress Notes (Signed)
    NURSE VISIT NOTE  Subjective:    Patient ID: Gabriella Pennington, female    DOB: 12-29-1985, 38 y.o.   MRN: [id number]  HPI  Patient is a 38 y.o. W2N5621 female who presents for fetal monitoring per order from Angelita Kendall, CNM.   Objective:    BP (!) 98/58   Pulse 78   Wt 230 lb 12.8 oz (104.7 kg)   LMP 07/05/2023 (Within Weeks)   BMI 40.88 kg/m  Estimated Date of Delivery: 04/06/24  [redacted]w[redacted]d  Fetus A Non-Stress Test Interpretation for 03/23/24  Indication: Obesity  Fetal Heart Rate A Mode: External Baseline Rate (A): 125 bpm Variability: Moderate Accelerations: 15 x 15 Decelerations: Variable Multiple birth?: No  Uterine Activity Mode: Toco Contraction Frequency (min): none  Interpretation (Fetal Testing) Nonstress Test Interpretation: Reactive Overall Impression: Reassuring for gestational age   Assessment:   No diagnosis found.   Plan:   Results reviewed and discussed with patient by  Donato Fu, CNM.     Donato Fu, CNM [facility]

## 2024-03-23 NOTE — Patient Instructions (Addendum)
 Your procedure is scheduled on: 03/30/24 - Friday  Arrival Time: Please call Labor and Delivery the day before your scheduled C-Section to find out your arrival time. [phone].  Arrival: If your arrival time is prior to 6:00 am, please enter through the Emergency Room Entrance and you will be directed to Labor and Delivery. If your arrival time is 6:00 am or later, please enter the Medical Mall and follow the greeter's instructions.  REMEMBER: Instructions that are not followed completely may result in serious medical risk, up to and including death; or upon the discretion of your surgeon and anesthesiologist your surgery may need to be rescheduled.  Do not eat food or drink any liquids after midnight the night before surgery.  No gum chewing or hard candies.   One week prior to surgery: Stop Anti-inflammatories (NSAIDS) such as Advil , Aleve, Ibuprofen , Motrin , Naproxen, Naprosyn and Aspirin  based products such as Excedrin, Goody's Powder, BC Powder. You may take Tylenol  if needed for pain up until the day of surgery.  Stop ANY OVER THE COUNTER supplements until after surgery.  Continue taking all of your other prescription medications up until the day of surgery.  ON THE DAY OF SURGERY ONLY TAKE THESE MEDICATIONS WITH SIPS OF WATER:  omeprazole  (PRILOSEC  OTC)    No Alcohol for 24 hours before or after surgery.  No Smoking including e-cigarettes for 24 hours prior to surgery.  No chewable tobacco products for at least 6 hours prior to surgery.  No nicotine  patches on the day of surgery.  Do not use any "recreational" drugs for at least a week prior to your surgery.  Please be advised that the combination of cocaine and anesthesia may have negative outcomes, up to and including death. If you test positive for cocaine, your surgery will be cancelled.  On the morning of surgery brush your teeth with toothpaste and water, you may rinse your mouth with mouthwash if you wish. Do  not swallow any toothpaste or mouthwash.  Use CHG wipes as directed on instruction sheet.  Do not wear jewelry, make-up, hairpins, clips or nail polish.  For welded (permanent) jewelry: bracelets, anklets, waist bands, etc.  Please have this removed prior to surgery.  If it is not removed, there is a chance that hospital personnel will need to cut it off on the day of surgery.  Do not wear lotions, powders, or perfumes.   Do not shave body hair from the neck down 48 hours before surgery.  Contact lenses, hearing aids and dentures may not be worn into surgery.  Do not bring valuables to the hospital. [facility] is not responsible for any missing/lost belongings or valuables.   Notify your doctor if there is any change in your medical condition (cold, fever, infection).  Wear comfortable clothing (specific to your surgery type) to the hospital.  After surgery, you can help prevent lung complications by doing breathing exercises.  Take deep breaths and cough every 1-2 hours. Your doctor may order a device called an Incentive Spirometer to help you take deep breaths.  When coughing or sneezing, hold a pillow firmly against your incision with both hands. This is called "splinting." Doing this helps protect your incision. It also decreases belly discomfort.  Please call the Pre-admissions Testing Dept. at [phone] if you have any questions about these instructions.  Surgery Visitation Policy:  Visitor Passes   All visitors, including children, need an identification sticker when visiting. These stickers must be worn where they can  be seen.   Labor & Delivery  Laboring women may have one designated support person and two other visitors of any age visit. The support person must remain the same. The visitors may switch with other visitors. Visitation is permitted 24 hours per day. The designated support person or a visitor over the age of 16 may sleep overnight in the patient's  room. A doula registered with [place] for labor and delivery support is not considered a visitor. Doulas not registered with [place] are considered visitors.  Mother Baby Unit, OB Specialty and Gynecological Care  A designated support person and three visitors of any age may visit. The three visitors may switch out. The designated support person or a visitor age 58 or older may stay overnight in the room. During the postpartum period (up to 6 weeks), if the mother is the patient, she can have her newborn stay with her if there is another support person present who can be responsible for the baby.      Preparing for Surgery with CHLORHEXIDINE  GLUCONATE (CHG) Soap  Chlorhexidine  Gluconate (CHG) Soap  o An antiseptic cleaner that kills germs and bonds with the skin to continue killing germs even after washing  o Used for showering the night before surgery and morning of surgery  Before surgery, you can play an important role by reducing the number of germs on your skin.  CHG (Chlorhexidine  gluconate) soap is an antiseptic cleanser which kills germs and bonds with the skin to continue killing germs even after washing.  Please do not use if you have an allergy to CHG or antibacterial soaps. If your skin becomes reddened/irritated stop using the CHG.  1. Shower the NIGHT BEFORE SURGERY and the MORNING OF SURGERY with CHG soap.  2. If you choose to wash your hair, wash your hair first as usual with your normal shampoo.  3. After shampooing, rinse your hair and body thoroughly to remove the shampoo.  4. Use CHG as you would any other liquid soap. You can apply CHG directly to the skin and wash gently with a scrungie or a clean washcloth.  5. Apply the CHG soap to your body only from the neck down. Do not use on open wounds or open sores. Avoid contact with your eyes, ears, mouth, and genitals (private parts). Wash face and genitals (private parts) with your normal soap.  6. Wash  thoroughly, paying special attention to the area where your surgery will be performed.  7. Thoroughly rinse your body with warm water.  8. Do not shower/wash with your normal soap after using and rinsing off the CHG soap.  9. Pat yourself dry with a clean towel.  10. Wear clean pajamas to bed the night before surgery.  12. Place clean sheets on your bed the night of your first shower and do not sleep with pets.  13. Shower again with the CHG soap on the day of surgery prior to arriving at the hospital.  14. Do not apply any deodorants/lotions/powders.  15. Please wear clean clothes to the hospital.

## 2024-03-23 NOTE — Assessment & Plan Note (Signed)
 rC/D scheduled for 6/9. Reviewed where to go at hospital

## 2024-03-23 NOTE — Assessment & Plan Note (Signed)
Reactive NST today 

## 2024-03-23 NOTE — Progress Notes (Signed)
    Return Prenatal Note   Assessment/Plan   Plan  38 y.o. [id number] at [redacted]w[redacted]d presents for follow-up OB visit. Reviewed prenatal record including previous visit note.  Obesity affecting pregnancy in third trimester Reactive NST today  Previous cesarean delivery, antepartum rC/D scheduled for 6/9. Reviewed where to go at hospital  AMA (advanced maternal age) multigravida 35+, third trimester Feeling well overall. Ready for baby but nervous about surgery. Reviewed labor warning signs and expectations for birth. Instructed to call office or come to hospital with persistent headache, vision changes, regular contractions, leaking of fluid, decreased fetal movement or vaginal bleeding.    No orders of the defined types were placed in this encounter.  No follow-ups on file.   Future Appointments  Date Time Provider Department Center  03/28/2024  1:30 PM [facility] None  04/10/2024  2:35 PM [name], MD [facility] None    For next visit:  postpartum follow up     Subjective   38 y.o. [id number] at [redacted]w[redacted]d presents for this follow-up prenatal visit.  Patient has no concerns today Patient reports: Movement: Present Contractions: Not present  Objective   Flow sheet Vitals: Pulse Rate: 78 BP: (!) 98/58 Fundal Height: 38 cm Fetal Heart Rate (bpm): 125 Total weight gain: 20 lb 12.8 oz (9.435 kg)  General Appearance  No acute distress, well appearing, and well nourished Pulmonary   Normal work of breathing Neurologic   Alert and oriented to person, place, and time Psychiatric   Mood and affect within normal limits  Donato Fu, CNM  03/23/2510:58 AM

## 2024-03-23 NOTE — Assessment & Plan Note (Signed)
 Feeling well overall. Ready for baby but nervous about surgery. Reviewed labor warning signs and expectations for birth. Instructed to call office or come to hospital with persistent headache, vision changes, regular contractions, leaking of fluid, decreased fetal movement or vaginal bleeding.

## 2024-03-28 ENCOUNTER — Encounter
Admission: RE | Admit: 2024-03-28 | Discharge: 2024-03-28 | Disposition: A | Discharge status: Home / Self Care | Source: Ambulatory Visit | Attending: Obstetrics | Admitting: Obstetrics

## 2024-03-28 DIAGNOSIS — Z01812 Encounter for preprocedural laboratory examination: Secondary | ICD-10-CM | POA: Insufficient documentation

## 2024-03-28 DIAGNOSIS — O34219 Maternal care for unspecified type scar from previous cesarean delivery: Secondary | ICD-10-CM | POA: Diagnosis not present

## 2024-03-28 LAB — CBC
HCT: 38.1 % (ref 36.0–46.0)
Hemoglobin: 12.9 g/dL (ref 12.0–15.0)
MCH: 29.8 pg (ref 26.0–34.0)
MCHC: 33.9 g/dL (ref 30.0–36.0)
MCV: 88 fL (ref 80.0–100.0)
Platelets: 195 10*3/uL (ref 150–400)
RBC: 4.33 MIL/uL (ref 3.87–5.11)
RDW: 12.4 % (ref 11.5–15.5)
WBC: 8.8 10*3/uL (ref 4.0–10.5)
nRBC: 0 % (ref 0.0–0.2)

## 2024-03-28 LAB — TYPE AND SCREEN
ABO/RH(D): O POS
Antibody Screen: NEGATIVE
Extend sample reason: UNDETERMINED

## 2024-03-29 LAB — RPR: RPR Ser Ql: NONREACTIVE

## 2024-03-30 ENCOUNTER — Other Ambulatory Visit: Payer: Self-pay

## 2024-03-30 ENCOUNTER — Encounter
Admission: RE | Disposition: A | Discharge status: Home / Self Care | Payer: Self-pay | Source: Home / Self Care | Attending: Obstetrics

## 2024-03-30 ENCOUNTER — Inpatient Hospital Stay
Admission: RE | Admit: 2024-03-30 | Discharge: 2024-04-01 | DRG: 788 | Disposition: A | Discharge status: Home / Self Care | Attending: Obstetrics | Admitting: Obstetrics

## 2024-03-30 ENCOUNTER — Inpatient Hospital Stay: Payer: Self-pay | Admitting: Urgent Care

## 2024-03-30 ENCOUNTER — Inpatient Hospital Stay: Admitting: Anesthesiology

## 2024-03-30 ENCOUNTER — Encounter: Payer: Self-pay | Admitting: Obstetrics

## 2024-03-30 DIAGNOSIS — Z7982 Long term (current) use of aspirin: Secondary | ICD-10-CM

## 2024-03-30 DIAGNOSIS — I959 Hypotension, unspecified: Secondary | ICD-10-CM | POA: Diagnosis not present

## 2024-03-30 DIAGNOSIS — Z5986 Financial insecurity: Secondary | ICD-10-CM

## 2024-03-30 DIAGNOSIS — E66813 Obesity, class 3: Secondary | ICD-10-CM | POA: Diagnosis present

## 2024-03-30 DIAGNOSIS — Z5941 Food insecurity: Secondary | ICD-10-CM | POA: Diagnosis not present

## 2024-03-30 DIAGNOSIS — Z87891 Personal history of nicotine dependence: Secondary | ICD-10-CM | POA: Diagnosis not present

## 2024-03-30 DIAGNOSIS — O99214 Obesity complicating childbirth: Secondary | ICD-10-CM | POA: Diagnosis present

## 2024-03-30 DIAGNOSIS — Z3A39 39 weeks gestation of pregnancy: Secondary | ICD-10-CM

## 2024-03-30 DIAGNOSIS — O09523 Supervision of elderly multigravida, third trimester: Secondary | ICD-10-CM | POA: Diagnosis not present

## 2024-03-30 DIAGNOSIS — O34211 Maternal care for low transverse scar from previous cesarean delivery: Secondary | ICD-10-CM | POA: Diagnosis present

## 2024-03-30 DIAGNOSIS — O34219 Maternal care for unspecified type scar from previous cesarean delivery: Secondary | ICD-10-CM | POA: Diagnosis present

## 2024-03-30 DIAGNOSIS — O2653 Maternal hypotension syndrome, third trimester: Secondary | ICD-10-CM

## 2024-03-30 LAB — CREATININE, SERUM
Creatinine, Ser: 0.58 mg/dL (ref 0.44–1.00)
GFR, Estimated: >60 mL/min (ref >60)

## 2024-03-30 SURGERY — Surgical Case
Anesthesia: Spinal

## 2024-03-30 MED ORDER — CEFAZOLIN SODIUM-DEXTROSE 2-4 GM/100ML-% IV SOLN
2.0000 g | INTRAVENOUS | Status: AC
  Administered 2024-03-30: 2 g via INTRAVENOUS
  Filled 2024-03-30: qty 100

## 2024-03-30 MED ORDER — MORPHINE SULFATE (PF) 0.5 MG/ML IJ SOLN
INTRAMUSCULAR | Status: DC | PRN
  Administered 2024-03-30: .1 mg via INTRATHECAL

## 2024-03-30 MED ORDER — CHLORHEXIDINE GLUCONATE 0.12 % MT SOLN
15.0000 mL | Freq: Once | OROMUCOSAL | Status: AC
  Administered 2024-03-30: 15 mL via OROMUCOSAL
  Filled 2024-03-30: qty 15

## 2024-03-30 MED ORDER — OXYTOCIN-SODIUM CHLORIDE 30-0.9 UT/500ML-% IV SOLN
2.5000 [IU]/h | INTRAVENOUS | Status: AC
  Administered 2024-03-30: 2.5 [IU]/h via INTRAVENOUS
  Filled 2024-03-30: qty 500

## 2024-03-30 MED ORDER — KETOROLAC TROMETHAMINE 30 MG/ML IJ SOLN
30.0000 mg | Freq: Four times a day (QID) | INTRAMUSCULAR | Status: AC
  Administered 2024-03-30 – 2024-03-31 (×4): 30 mg via INTRAVENOUS
  Filled 2024-03-30 (×4): qty 1

## 2024-03-30 MED ORDER — PRENATAL MULTIVITAMIN CH
1.0000 | ORAL_TABLET | Freq: Every day | ORAL | Status: DC
  Filled 2024-03-30: qty 1

## 2024-03-30 MED ORDER — MORPHINE SULFATE (PF) 0.5 MG/ML IJ SOLN
INTRAMUSCULAR | Status: AC
  Filled 2024-03-30: qty 10

## 2024-03-30 MED ORDER — PHENYLEPHRINE HCL-NACL 20-0.9 MG/250ML-% IV SOLN
INTRAVENOUS | Status: AC
  Filled 2024-03-30: qty 250

## 2024-03-30 MED ORDER — ONDANSETRON HCL 4 MG/2ML IJ SOLN: 4.0000 mg | Freq: Three times a day (TID) | INTRAMUSCULAR | Status: DC | PRN

## 2024-03-30 MED ORDER — ZOLPIDEM TARTRATE 5 MG PO TABS: 5.0000 mg | ORAL_TABLET | Freq: Every evening | ORAL | Status: DC | PRN

## 2024-03-30 MED ORDER — OXYCODONE HCL 5 MG PO TABS
5.0000 mg | ORAL_TABLET | ORAL | Status: DC | PRN
  Administered 2024-04-01 (×2): 5 mg via ORAL
  Filled 2024-03-30 (×2): qty 1

## 2024-03-30 MED ORDER — OXYTOCIN-SODIUM CHLORIDE 30-0.9 UT/500ML-% IV SOLN
INTRAVENOUS | Status: DC | PRN
  Administered 2024-03-30: 435 mL/h via INTRAVENOUS

## 2024-03-30 MED ORDER — KETOROLAC TROMETHAMINE 30 MG/ML IJ SOLN: 30.0000 mg | Freq: Four times a day (QID) | INTRAMUSCULAR | Status: AC | PRN

## 2024-03-30 MED ORDER — ENOXAPARIN SODIUM 40 MG/0.4ML IJ SOSY
40.0000 mg | PREFILLED_SYRINGE | INTRAMUSCULAR | Status: DC
  Administered 2024-03-31 – 2024-04-01 (×2): 40 mg via SUBCUTANEOUS
  Filled 2024-03-30 (×2): qty 0.4

## 2024-03-30 MED ORDER — MEPERIDINE HCL 25 MG/ML IJ SOLN: 6.2500 mg | INTRAMUSCULAR | Status: DC | PRN

## 2024-03-30 MED ORDER — LIDOCAINE HCL (PF) 2 % IJ SOLN: INTRAMUSCULAR | Status: DC | PRN

## 2024-03-30 MED ORDER — EPHEDRINE SULFATE-NACL 50-0.9 MG/10ML-% IV SOSY
PREFILLED_SYRINGE | INTRAVENOUS | Status: DC | PRN
  Administered 2024-03-30 (×3): 10 mg via INTRAVENOUS
  Administered 2024-03-30 (×2): 5 mg via INTRAVENOUS

## 2024-03-30 MED ORDER — DIPHENHYDRAMINE HCL 25 MG PO CAPS
25.0000 mg | ORAL_CAPSULE | Freq: Four times a day (QID) | ORAL | Status: DC | PRN
  Administered 2024-03-30 – 2024-03-31 (×3): 25 mg via ORAL
  Filled 2024-03-30 (×2): qty 1

## 2024-03-30 MED ORDER — NALOXONE HCL 0.4 MG/ML IJ SOLN: 0.4000 mg | INTRAMUSCULAR | Status: DC | PRN

## 2024-03-30 MED ORDER — ACETAMINOPHEN 500 MG PO TABS
1000.0000 mg | ORAL_TABLET | Freq: Four times a day (QID) | ORAL | Status: DC
  Administered 2024-03-30 – 2024-04-01 (×8): 1000 mg via ORAL
  Filled 2024-03-30 (×9): qty 2

## 2024-03-30 MED ORDER — BUPIVACAINE HCL 0.5 % IJ SOLN
INTRAMUSCULAR | Status: DC | PRN
  Administered 2024-03-30: 10 mL

## 2024-03-30 MED ORDER — FENTANYL CITRATE (PF) 100 MCG/2ML IJ SOLN: 25.0000 ug | INTRAMUSCULAR | Status: DC | PRN

## 2024-03-30 MED ORDER — EPHEDRINE 5 MG/ML INJ
10.0000 mg | INTRAVENOUS | Status: DC | PRN
  Administered 2024-03-30: 10 mg via INTRAVENOUS
  Filled 2024-03-30: qty 5

## 2024-03-30 MED ORDER — BUPIVACAINE 0.25 % ON-Q PUMP DUAL CATH 400 ML
400.0000 mL | INJECTION | Status: DC
  Filled 2024-03-30: qty 400

## 2024-03-30 MED ORDER — OXYTOCIN-SODIUM CHLORIDE 30-0.9 UT/500ML-% IV SOLN
INTRAVENOUS | Status: AC
  Filled 2024-03-30: qty 500

## 2024-03-30 MED ORDER — FENTANYL CITRATE (PF) 100 MCG/2ML IJ SOLN
INTRAMUSCULAR | Status: AC
  Filled 2024-03-30: qty 2

## 2024-03-30 MED ORDER — BUPIVACAINE HCL (PF) 0.5 % IJ SOLN
5.0000 mL | Freq: Once | INTRAMUSCULAR | Status: DC
  Filled 2024-03-30: qty 10

## 2024-03-30 MED ORDER — BUPIVACAINE IN DEXTROSE 0.75-8.25 % IT SOLN
INTRATHECAL | Status: DC | PRN
Start: 2024-03-30 — End: 2024-03-30
  Administered 2024-03-30: 1.7 mL via INTRATHECAL

## 2024-03-30 MED ORDER — DIPHENHYDRAMINE HCL 25 MG PO CAPS
25.0000 mg | ORAL_CAPSULE | ORAL | Status: DC | PRN
  Filled 2024-03-30: qty 1

## 2024-03-30 MED ORDER — ACETAMINOPHEN 500 MG PO TABS
1000.0000 mg | ORAL_TABLET | ORAL | Status: AC
  Administered 2024-03-30: 1000 mg via ORAL
  Filled 2024-03-30: qty 2

## 2024-03-30 MED ORDER — LACTATED RINGERS IV SOLN
Freq: Once | INTRAVENOUS | Status: AC
Start: 2024-03-30 — End: 2024-03-30

## 2024-03-30 MED ORDER — DIPHENHYDRAMINE HCL 50 MG/ML IJ SOLN
12.5000 mg | INTRAMUSCULAR | Status: DC | PRN
  Administered 2024-03-30: 12.5 mg via INTRAVENOUS
  Filled 2024-03-30: qty 1

## 2024-03-30 MED ORDER — SODIUM CHLORIDE 0.9 % IV SOLN: 12.5000 mg | INTRAVENOUS | Status: DC | PRN

## 2024-03-30 MED ORDER — COCONUT OIL OIL: 1.0000 | TOPICAL_OIL | Status: DC | PRN

## 2024-03-30 MED ORDER — ONDANSETRON HCL 4 MG/2ML IJ SOLN
INTRAMUSCULAR | Status: DC | PRN
  Administered 2024-03-30: 4 mg via INTRAVENOUS

## 2024-03-30 MED ORDER — IBUPROFEN 600 MG PO TABS
600.0000 mg | ORAL_TABLET | Freq: Four times a day (QID) | ORAL | Status: DC
  Administered 2024-03-31 – 2024-04-01 (×5): 600 mg via ORAL
  Filled 2024-03-30 (×5): qty 1

## 2024-03-30 MED ORDER — PHENYLEPHRINE 80 MCG/ML (10ML) SYRINGE FOR IV PUSH (FOR BLOOD PRESSURE SUPPORT)
PREFILLED_SYRINGE | INTRAVENOUS | Status: AC
  Filled 2024-03-30: qty 10

## 2024-03-30 MED ORDER — SOD CITRATE-CITRIC ACID 500-334 MG/5ML PO SOLN
30.0000 mL | ORAL | Status: AC
  Administered 2024-03-30: 30 mL via ORAL

## 2024-03-30 MED ORDER — KETOROLAC TROMETHAMINE 30 MG/ML IJ SOLN
INTRAMUSCULAR | Status: AC
  Filled 2024-03-30: qty 1

## 2024-03-30 MED ORDER — WITCH HAZEL-GLYCERIN EX PADS: 1.0000 | MEDICATED_PAD | CUTANEOUS | Status: DC | PRN

## 2024-03-30 MED ORDER — SODIUM CHLORIDE 0.9% FLUSH: 3.0000 mL | INTRAVENOUS | Status: DC | PRN

## 2024-03-30 MED ORDER — DIBUCAINE (PERIANAL) 1 % EX OINT
1.0000 | TOPICAL_OINTMENT | CUTANEOUS | Status: DC | PRN
Start: 2024-03-30 — End: 2024-04-01

## 2024-03-30 MED ORDER — EPHEDRINE 5 MG/ML INJ
INTRAVENOUS | Status: AC
  Filled 2024-03-30: qty 10

## 2024-03-30 MED ORDER — DEXAMETHASONE SODIUM PHOSPHATE 10 MG/ML IJ SOLN
INTRAMUSCULAR | Status: AC
  Filled 2024-03-30: qty 1

## 2024-03-30 MED ORDER — PHENYLEPHRINE 80 MCG/ML (10ML) SYRINGE FOR IV PUSH (FOR BLOOD PRESSURE SUPPORT)
PREFILLED_SYRINGE | INTRAVENOUS | Status: DC | PRN
  Administered 2024-03-30 (×4): 160 ug via INTRAVENOUS

## 2024-03-30 MED ORDER — FENTANYL CITRATE (PF) 100 MCG/2ML IJ SOLN
INTRAMUSCULAR | Status: DC | PRN
  Administered 2024-03-30: 15 ug via INTRATHECAL

## 2024-03-30 MED ORDER — ORAL CARE MOUTH RINSE: 15.0000 mL | Freq: Once | OROMUCOSAL | Status: AC

## 2024-03-30 MED ORDER — DEXAMETHASONE SODIUM PHOSPHATE 10 MG/ML IJ SOLN
INTRAMUSCULAR | Status: DC | PRN
Start: 2024-03-30 — End: 2024-03-30
  Administered 2024-03-30: 8 mg via INTRAVENOUS

## 2024-03-30 MED ORDER — KETOROLAC TROMETHAMINE 30 MG/ML IJ SOLN
INTRAMUSCULAR | Status: DC | PRN
  Administered 2024-03-30: 15 mg via INTRAVENOUS

## 2024-03-30 MED ORDER — SENNOSIDES-DOCUSATE SODIUM 8.6-50 MG PO TABS
2.0000 | ORAL_TABLET | Freq: Every day | ORAL | Status: DC
  Administered 2024-03-31 – 2024-04-01 (×2): 2 via ORAL
  Filled 2024-03-30 (×2): qty 2

## 2024-03-30 MED ORDER — SIMETHICONE 80 MG PO CHEW
80.0000 mg | CHEWABLE_TABLET | ORAL | Status: DC | PRN
  Administered 2024-03-30 – 2024-03-31 (×2): 80 mg via ORAL
  Filled 2024-03-30 (×2): qty 1

## 2024-03-30 MED ORDER — LACTATED RINGERS IV SOLN: INTRAVENOUS | Status: DC

## 2024-03-30 MED ORDER — SOD CITRATE-CITRIC ACID 500-334 MG/5ML PO SOLN
ORAL | Status: AC
  Filled 2024-03-30: qty 15

## 2024-03-30 MED ORDER — SIMETHICONE 80 MG PO CHEW
80.0000 mg | CHEWABLE_TABLET | Freq: Three times a day (TID) | ORAL | Status: DC
  Administered 2024-03-30: 80 mg via ORAL
  Filled 2024-03-30 (×3): qty 1

## 2024-03-30 MED ORDER — LIDOCAINE HCL (CARDIAC) PF 50 MG/5ML IV SOSY
PREFILLED_SYRINGE | INTRAVENOUS | Status: DC | PRN
  Administered 2024-03-30: 3 mL via INTRAVENOUS

## 2024-03-30 MED ORDER — POVIDONE-IODINE 10 % EX SWAB
2.0000 | Freq: Once | CUTANEOUS | Status: AC
Start: 2024-03-30 — End: 2024-03-30
  Administered 2024-03-30: 2 via TOPICAL

## 2024-03-30 MED ORDER — GLYCOPYRROLATE 0.2 MG/ML IJ SOLN
INTRAMUSCULAR | Status: DC | PRN
Start: 2024-03-30 — End: 2024-03-30
  Administered 2024-03-30 (×2): .2 mg via INTRAVENOUS

## 2024-03-30 MED ORDER — PHENYLEPHRINE HCL-NACL 20-0.9 MG/250ML-% IV SOLN
INTRAVENOUS | Status: DC | PRN
  Administered 2024-03-30: 40 ug/min via INTRAVENOUS

## 2024-03-30 MED ORDER — BUPIVACAINE ON-Q PAIN PUMP (FOR ORDER SET NO CHG)
INJECTION | Status: DC
  Filled 2024-03-30: qty 1

## 2024-03-30 MED ORDER — MENTHOL 3 MG MT LOZG: 1.0000 | LOZENGE | OROMUCOSAL | Status: DC | PRN

## 2024-03-30 MED ORDER — ONDANSETRON HCL 4 MG/2ML IJ SOLN
INTRAMUSCULAR | Status: AC
  Filled 2024-03-30: qty 2

## 2024-03-30 MED ORDER — OXYCODONE HCL 5 MG PO TABS
5.0000 mg | ORAL_TABLET | Freq: Four times a day (QID) | ORAL | Status: DC | PRN
  Administered 2024-04-01: 5 mg via ORAL
  Filled 2024-03-30: qty 1

## 2024-03-30 MED ORDER — NALOXONE HCL 4 MG/10ML IJ SOLN: 1.0000 ug/kg/h | INTRAVENOUS | Status: DC | PRN

## 2024-03-30 SURGICAL SUPPLY — 26 items
BAG COUNTER SPONGE SURGICOUNT (BAG) ×1 IMPLANT
BENZOIN TINCTURE PRP APPL 2/3 (GAUZE/BANDAGES/DRESSINGS) IMPLANT
CHLORAPREP W/TINT 26 (MISCELLANEOUS) ×2 IMPLANT
CLOSURE STERI STRIP 1/2 X4 (GAUZE/BANDAGES/DRESSINGS) IMPLANT
DRESSING TELFA 8X10 (GAUZE/BANDAGES/DRESSINGS) IMPLANT
DRSG TELFA 3X8 NADH STRL (GAUZE/BANDAGES/DRESSINGS) ×1 IMPLANT
ELECTRODE REM PT RTRN 9FT ADLT (ELECTROSURGICAL) ×1 IMPLANT
GAUZE SPONGE 4X4 12PLY STRL (GAUZE/BANDAGES/DRESSINGS) ×1 IMPLANT
GLOVE BIO SURGEON STRL SZ 6 (GLOVE) ×1 IMPLANT
GLOVE BIOGEL PI IND STRL 6 (GLOVE) ×1 IMPLANT
GOWN STRL REUS W/ TWL LRG LVL3 (GOWN DISPOSABLE) ×2 IMPLANT
KIT TURNOVER KIT A (KITS) ×1 IMPLANT
MANIFOLD NEPTUNE II (INSTRUMENTS) ×1 IMPLANT
MAT PREVALON FULL STRYKER (MISCELLANEOUS) ×1 IMPLANT
NS IRRIG 1000ML POUR BTL (IV SOLUTION) ×1 IMPLANT
PACK C SECTION AR (MISCELLANEOUS) ×1 IMPLANT
PAD OB MATERNITY 11 LF (PERSONAL CARE ITEMS) ×1 IMPLANT
PAD PREP OB/GYN DISP 24X41 (PERSONAL CARE ITEMS) ×1 IMPLANT
SCRUB CHG 4% DYNA-HEX 4OZ (MISCELLANEOUS) ×1 IMPLANT
STRIP CLOSURE SKIN 1/2X4 (GAUZE/BANDAGES/DRESSINGS) IMPLANT
SUT MNCRL AB 4-0 PS2 18 (SUTURE) ×1 IMPLANT
SUT VIC AB 0 CT1 36 (SUTURE) ×4 IMPLANT
SUT VIC AB 3-0 SH 27X BRD (SUTURE) ×1 IMPLANT
TAPE TRANSPORE STRL 2 31045 (GAUZE/BANDAGES/DRESSINGS) IMPLANT
TRAP FLUID SMOKE EVACUATOR (MISCELLANEOUS) ×1 IMPLANT
WATER STERILE IRR 500ML POUR (IV SOLUTION) ×1 IMPLANT

## 2024-03-30 NOTE — Plan of Care (Signed)
 Patient tolerating regular diet at this time, no nausea, pain controlled with PRN meds and on Q pump, pressure dressing in place with no drainage, ambulating with assistance, visited infant in SCN once this shift.  Erlene Hawks, RN 03/30/24 @1724 

## 2024-03-30 NOTE — Anesthesia Procedure Notes (Signed)
 Spinal  Patient location during procedure: OR Start time: 03/30/2024 7:44 AM End time: 03/30/2024 7:51 AM Reason for block: surgical anesthesia Staffing Performed: resident/CRNA  Anesthesiologist: Vanice Genre, MD Resident/CRNA: Philippe Brazen, CRNA Performed by: Philippe Brazen, CRNA Authorized by: Vanice Genre, MD   Spinal Block Patient position: sitting Prep: ChloraPrep Patient monitoring: continuous pulse ox and blood pressure Approach: midline Location: L3-4 Injection technique: single-shot Needle Needle type: Pencan  Needle gauge: 24 G Assessment Sensory level: T4 Additional Notes Spinal placement without difficulty. Positive CSF swirl prior to injection and at end of injection. Patient tolerated well.

## 2024-03-30 NOTE — Op Note (Signed)
 CESAREAN SECTION OPERATIVE REPORT   DATE OF SURGERY: 03/30/24  SURGEON: [name], DO ASSISTANT:  Lydia Dominic,CNM ANESTHESIA: Spinal  PROCEDURE: Repeat low transverse cesarean section  PREOPERATIVE DIAGNOSES: 1. Intrauterine pregnancy at [redacted]w[redacted]d 2. History of previous cesarean section x 4 3. Hypotension from spinal 4. Fetal bradycardia 5. AMA 6. Elevated BMI  POSTOPERATIVE DIAGNOSES: 1. Same, s/p rLTCS  QBL:   250cc DRAINS: foley catheter to gravity drainage, 45 ml of clear urine at end of the procedure IV FLUIDS:1478ml SPECIMENS: None COMPLICATIONS:  None  FINDINGS:  Viable female infant in cephalic presentation; APGARs 8/8; weight 3600 grams (7lbs, 15oz) Clear fluid at amniotomy Intact placenta with 3 vessel cord Uterus, tubes, and ovaries appeared normal Lower uterine segment extremely thin  INDICATION and CONSENT: Gabriella Pennington is a 38 y.o. [id number] with IUP at [redacted]w[redacted]d presenting for scheduled repeat cesarean. The patient understood that the risks of cesarean section include, but are not limited to, visceral or vascular injury, infection, blood loss and need for transfusion, prolonged hospitalization, and reoperation.  The patient stated understanding and desired to proceed.  All questions were answered.  PROCEDURE:  After verbal and written informed consent was obtained, the patient was taken to the operating room where spinal anesthesia was found to be adequate. Patient became hypotensive and vomited. Fetal heart rate in the 60s and prep was done expeditiously. SCDs were applied to the lower extremities and a Foley catheter was placed in the bladder under sterile technique.  The patient was placed in dorsal supine position with a leftward tilt, prepped and draped in a sterile fashion.  Two grams of Cefazolin  were given for infection prophylaxis.  Level of anesthesia was confirmed to be adequate with Allis clamps.  A Pfannenstiel skin incision was made with the scalpel  and carried down to the underlying layer of rectus fascia.  The fascia was nicked bilaterally in the midline with the scalpel and the fascial incision was extended laterally using Mayo scissors.  The superior aspect of the fascia was grasped with Kocher clamps and the underlying rectus muscles were dissected off sharply with Mayo scissors and bluntly.  In a similar fashion, the inferior aspect of fascia was grasped with Kocher clamps and the underlying rectus and pyramidalis were dissected off sharply and bluntly.  The rectus muscles were separated in the midline sharply.  The peritoneum was found to be free of adherent bowel and entered sharply.  The peritoneal incision was extended bluntly to the bladder reflection with good visualization of the bladder.  The Alexis retractor was inserted and vesicouterine peritoneum identified.  Intraabdomnial survey revealed scant, clear peritoneal fluid and a thinned-out lower uterine segment.  The lower uterine segment was incised transversely with the scalpel.  The amniotic sac was ruptured with an Allis clamp and clear fluid noted.  The uterine incision was extended bluntly in a cranial-caudal fashion.  The fetus was in cephalic presentation.  The head was flexed and elevated to the level of the uterine incision.  Gentle fundal pressure was applied by the assistant and the infant was delivered without difficulty.  The nose and mouth were suctioned with a bulb. Infant was shown to the patient. Delayed cord clamping was performed for 60 seconds. The cord was doubly clamped and cut and cord blood obtained.  The infant was handed to the awaiting NICU team.  The placenta delivered intact & spontaneously with manual massage of the uterine fundus. The uterus was left in situ.  The inside  of the uterus was gently wiped with lap sponges x 2 ensuring complete removal of placental membranes.  The uterine incision was repaired with a double layer closure of 0-Vicryl first in a  locking fashion, followed by 0-Vicryl in an imbricating stitch, with excellent hemostasis achieved.  The ovaries and tubes were found to be grossly normal.  Blood clots, debris and fluid were cleaned from the abdomen, gutters, and pelvis with moist laparotomy sponges.  The uterine incision was reinspected and was hemostatic.   The On-Q catheter pumps were inserted in accordance with the manufacturer's recommendations.  The catheters were inserted approximately 4cm cephelad to the incision line, approximately 1cm apart, straddling the midline.  They were inserted to a depth of the 4th mark. They were positioned superficial to the rectus abdominus muscles and deep to the rectus fascia.    The superior and inferior fascia were grasped with Kocher clamps and the rectus muscles were examined and found to be hemostatic, ensured with Bovie electrocautery.  The peritoneum was closed with 3-0 Monocryl in a continuous, running fashion. The fascial layer was closed with 0-Vicryl in a running fashion.  The subcutaneous tissue was irrigated, made hemostatic with Bovie electrocautery, then reapproximated with a running layer of 3-0 Plain. The skin was closed subcuticularly with 4-0 Vicryl on a keith and steristrips and a sterile pressure dressing  was placed.  The On-Q catheters were bolused with 5 mL of 0.5% marcaine  plain for a total of 10 mL.  The catheters were affixed to the skin with surgical skin glue, steri-strips, and tegaderm.  All sponge, lap and instrument counts were correct x 2. The patient tolerated the procedure well and was taken to the recovery room in stable condition.  An experienced assistant was required given the standard of surgical care given the complexity of the case.  This assistant was needed for exposure, dissection, suctioning, retraction, instrument exchange, and for overall help during the procedure.   [name], DO [facility]

## 2024-03-30 NOTE — Anesthesia Preprocedure Evaluation (Addendum)
 Anesthesia Evaluation  Patient identified by MRN, date of birth, ID band Patient awake  General Assessment Comment:Cocaine+. Does not appear acutely intoxicated  Quaternary c/section.   Reviewed: Allergy & Precautions, NPO status , Patient's Chart, lab work & pertinent test results  History of Anesthesia Complications Negative for: history of anesthetic complications  Airway Mallampati: II  TM Distance: >3 FB Neck ROM: Full   Comment: Tongue ring in place, not easily removable Dental  (+) Poor Dentition, Chipped, Dental Advidsory Given   Pulmonary neg shortness of breath, neg sleep apnea, neg COPD, neg recent URI, Patient did not abstain from smoking., former smoker   Pulmonary exam normal breath sounds clear to auscultation       Cardiovascular Exercise Tolerance: Good METS(-) hypertension(-) angina (-) CAD and (-) Past MI (-) dysrhythmias + Valvular Problems/Murmurs  Rhythm:Regular Rate:Normal - Systolic murmurs    Neuro/Psych neg Seizures negative neurological ROS  negative psych ROS   GI/Hepatic ,neg GERD  ,,(+)     substance abuse  cocaine use and marijuana usePatient cocaine positive today, but denies any recent use.   Endo/Other  neg diabetes  Class 3 obesity  Renal/GU negative Renal ROS     Musculoskeletal   Abdominal   Peds  Hematology   Anesthesia Other Findings Past Medical History: No date: COVID-19 No date: Heart murmur     Comment:  since birth  Reproductive/Obstetrics (+) Pregnancy                             Anesthesia Physical Anesthesia Plan  ASA: 3  Anesthesia Plan: Spinal   Post-op Pain Management:    Induction:   PONV Risk Score and Plan: 3 and Ondansetron , Dexamethasone and Treatment may vary due to age or medical condition  Airway Management Planned: Natural Airway  Additional Equipment:   Intra-op Plan:   Post-operative Plan:   Informed  Consent: I have reviewed the patients History and Physical, chart, labs and discussed the procedure including the risks, benefits and alternatives for the proposed anesthesia with the patient or authorized representative who has indicated his/her understanding and acceptance.       Plan Discussed with: CRNA and Surgeon  Anesthesia Plan Comments: (Discussed R/B/A of neuraxial anesthesia technique with patient: - rare risks of spinal/epidural hematoma, nerve damage, infection - Risk of PDPH - Risk of itching - Risk of nausea and vomiting - Risk of conversion to general anesthesia and its associated risks, including sore throat, damage to lips/teeth/oropharynx, and rare risks such as cardiac and respiratory events. - Risk of surgical bleeding requiring blood products - Increased risk of above two complications due to quaternary c-section and cocaine+ urine.  Patient counseled on benefits of smoking cessation, and increased perioperative risks associated with continued smoking.  Patient voiced understanding.)        Anesthesia Quick Evaluation

## 2024-03-30 NOTE — Transfer of Care (Signed)
 Immediate Anesthesia Transfer of Care Note  Patient: Gabriella Pennington  Procedure(s) Performed: CESAREAN DELIVERY  Patient Location: LDR 6  Anesthesia Type:Spinal  Level of Consciousness: awake, oriented, and patient cooperative  Airway & Oxygen Therapy: Patient Spontanous Breathing  Post-op Assessment: Report given to RN and Post -op Vital signs reviewed and stable  Post vital signs: Reviewed and stable  Last Vitals:  Vitals Value Taken Time  BP 100/49 03/30/24 0943  Temp 97.4   Pulse 64 03/30/24 0944  Resp 17 03/30/24 0944  SpO2 94 % 03/30/24 0944  Vitals shown include unfiled device data.  Last Pain:  Vitals:   03/30/24 0552  TempSrc: Oral         Complications: No notable events documented.

## 2024-03-30 NOTE — H&P (Signed)
 Obstetric Preoperative History and Physical  Gabriella Pennington is a 38 y.o. [id number] with IUP at [redacted]w[redacted]d presenting for scheduled repeat cesarean section.  Reports good fetal movement, no bleeding, no contractions, no leaking of fluid.  No acute preoperative concerns.    Cesarean Section Indication: previous c-section x 4  Prenatal Course Source of Care: [facility]  with onset of care at 15 weeks Pregnancy complications or risks: Patient Active Problem List   Diagnosis Date Noted   Obesity affecting pregnancy in third trimester 01/03/2024   AMA (advanced maternal age) multigravida 35+, third trimester 01/03/2024   Previous cesarean delivery, antepartum 01/03/2024   Supervision of other normal pregnancy, antepartum 10/10/2023   She plans to bottle feed She desires Nexplanon for postpartum contraception.   Prenatal labs and studies: ABO, Rh: --/--/O POS (05/28 1339) Antibody: NEG (05/28 1339) Rubella: 1.66 (12/13 1402) RPR: NON REACTIVE (05/28 1339)  HBsAg: Negative (12/13 1402)  HIV: Non Reactive (03/04 1111)  KGM:WNUUVOZD/-- (05/07 1107) 1 hr Glucola  134 Genetic screening normal Anatomy US  normal  Past Medical History:  Diagnosis Date   COVID-19    Heart murmur    since birth   History of 3 cesarean sections 12/11/2020   Plan repeat CS 04/09/21        History of cesarean section 04/09/2021   Obesity affecting pregnancy in first trimester 10/07/2020    Past Surgical History:  Procedure Laterality Date   CESAREAN SECTION     (2007, 2008' 2015)   CESAREAN SECTION Bilateral 04/09/2021   Procedure: Repeat Cesarean Section;  Surgeon: Alben Alma, MD;  Location: [facility] ORS;  Service: Obstetrics;  Laterality: Bilateral;   TONSILLECTOMY     age [age]   WISDOM TOOTH EXTRACTION  2022   four;    OB History  Gravida Para Term Preterm AB Living  5 4 4  0 0 4  SAB IAB Ectopic Multiple Live Births  0 0 0  4    # Outcome Date GA Lbr Len/2nd Weight Sex Type Anes PTL Lv  5 Current            4 Term 04/09/21 [redacted]w[redacted]d  3270 g M CS-LTranv Spinal  LIV  3 Term 01/15/14    M CS-LTranv     2 Term 10/02/07    M CS-LTranv     1 Term 01/17/06    F CS-LTranv       Social History   Socioeconomic History   Marital status: Single    Spouse name: Not on file   Number of children: 4   Years of education: 12.5   Highest education level: Not on file  Occupational History   Occupation: Production designer, theatre/television/film at [employer]  Tobacco Use   Smoking status: Former    Current packs/day: 0.20    Types: Cigarettes   Smokeless tobacco: Never  Vaping Use   Vaping status: Every Day   Start date: 04/11/2021   Substances: Nicotine    Devices: trying to quit  Substance and Sexual Activity   Alcohol use: Not Currently    Alcohol/week: 4.0 standard drinks of alcohol    Types: 4 Shots of liquor per week    Comment: last use- 08/31/20   Drug use: Not Currently    Types: Marijuana    Comment: last marijuana use- 10 yrs ago   Sexual activity: Yes    Partners: Male  Other Topics Concern   Not on file  Social History Narrative   ** Merged History Encounter **       **  Merged History Encounter **       Social Drivers of Health   Financial Resource Strain: High Risk (10/10/2023)   Overall Financial Resource Strain (CARDIA)    Difficulty of Paying Living Expenses: Very hard  Food Insecurity: Food Insecurity Present (03/30/2024)   Hunger Vital Sign    Worried About Running Out of Food in the Last Year: Sometimes true    Ran Out of Food in the Last Year: Never true  Transportation Needs: No Transportation Needs (03/30/2024)   PRAPARE - Administrator, Civil Service (Medical): No    Lack of Transportation (Non-Medical): No  Physical Activity: Inactive (10/10/2023)   Exercise Vital Sign    Days of Exercise per Week: 0 days    Minutes of Exercise per Session: 0 min  Stress: Stress Concern Present (10/10/2023)   [employer] of Occupational Health - Occupational Stress Questionnaire     Feeling of Stress : To some extent  Social Connections: Moderately Integrated (03/30/2024)   Social Connection and Isolation Panel [NHANES]    Frequency of Communication with Friends and Family: More than three times a week    Frequency of Social Gatherings with Friends and Family: More than three times a week    Attends Religious Services: 1 to 4 times per year    Active Member of [employer] or Organizations: Yes    Attends Banker Meetings: 1 to 4 times per year    Marital Status: Never married    Family History  Problem Relation Age of Onset   Healthy Mother    Healthy Father    Asthma Brother    Cancer Maternal Grandmother        colon   Healthy Maternal Grandfather    Healthy Paternal Grandmother    Healthy Paternal Grandfather     Medications Prior to Admission  Medication Sig Dispense Refill Last Dose/Taking   aspirin  EC 81 MG tablet Take 1 tablet (81 mg total) by mouth daily. Swallow whole.   Taking   docusate (COLACE) 50 MG/5ML liquid Take 10 mLs (100 mg total) by mouth 2 (two) times daily. 473 mL 1 Taking   omeprazole  (PRILOSEC  OTC) 20 MG tablet Take 1 tablet (20 mg total) by mouth daily. 60 tablet 2 Taking   ondansetron  (ZOFRAN -ODT) 4 MG disintegrating tablet Take 1 tablet (4 mg total) by mouth every 8 (eight) hours as needed. 30 tablet 1 Taking As Needed   Prenatal Vit-Fe Fumarate-FA (MULTIVITAMIN-PRENATAL) 27-0.8 MG TABS tablet Take 1 tablet by mouth daily at 12 noon.   Taking    Allergies  Allergen Reactions   Penicillins Hives    Review of Systems: Pertinent items noted in HPI and remainder of comprehensive ROS otherwise negative.  Physical Exam: BP 119/81   Pulse (!) 127   Temp 98.2 F (36.8 C) (Oral)   Resp 16   Ht 5\' 3"  (1.6 m)   Wt 104.6 kg   LMP 07/05/2023 (Within Weeks)   BMI 40.85 kg/m  FHR by Doppler: 130s bpm CONSTITUTIONAL: Well-developed, well-nourished female in no acute distress.  HENT:  Normocephalic, atraumatic, External right  and left ear normal. Oropharynx is clear and moist EYES: Conjunctivae and EOM are normal. Pupils are equal, round, and reactive to light. No scleral icterus.  NECK: Normal range of motion, supple, no masses SKIN: Skin is warm and dry. No rash noted. Not diaphoretic. No erythema. No pallor. NEUROLOGIC: Alert and oriented to person, place, and time. Normal reflexes, muscle tone  coordination. No cranial nerve deficit noted. PSYCHIATRIC: Normal mood and affect. Normal behavior. Normal judgment and thought content. CARDIOVASCULAR: Normal heart rate noted, regular rhythm RESPIRATORY: Effort and breath sounds normal, no problems with respiration noted ABDOMEN: Soft, nontender, nondistended, gravid. Well-healed Pfannenstiel incision. PELVIC: Deferred MUSCULOSKELETAL: Normal range of motion. No edema and no tenderness. 2+ distal pulses.  Pertinent Labs/Studies:   Results for orders placed or performed during the hospital encounter of 03/28/24 (from the past 72 hours)  CBC     Status: None   Collection Time: 03/28/24  1:39 PM  Result Value Ref Range   WBC 8.8 4.0 - 10.5 K/uL   RBC 4.33 3.87 - 5.11 MIL/uL   Hemoglobin 12.9 12.0 - 15.0 g/dL   HCT 09.8 11.9 - 14.7 %   MCV 88.0 80.0 - 100.0 fL   MCH 29.8 26.0 - 34.0 pg   MCHC 33.9 30.0 - 36.0 g/dL   RDW 82.9 56.2 - 13.0 %   Platelets 195 150 - 400 K/uL   nRBC 0.0 0.0 - 0.2 %    Comment: Performed at [facility], [address] [place], [place] [address]  RPR     Status: None   Collection Time: 03/28/24  1:39 PM  Result Value Ref Range   RPR Ser Ql NON REACTIVE NON REACTIVE    Comment: Performed at [facility], [address] [place], [place] [address]  Type and screen     Status: None   Collection Time: 03/28/24  1:39 PM  Result Value Ref Range   ABO/RH(D) O POS    Antibody Screen NEG    Sample Expiration 03/31/2024,2359    Extend sample reason      PREGNANT WITHIN 3 MONTHS, UNABLE TO EXTEND Performed at [facility], [address], [place], [place] [address]    Assessment and Plan: Gabriella Pennington is a 38 y.o. [id number] at [redacted]w[redacted]d being admitted for scheduled repeat cesarean section. The risks of surgery were discussed with the patient including but were not limited to: bleeding which may require transfusion or reoperation; infection which may require antibiotics; injury to bowel, bladder, ureters or other surrounding organs; injury to the fetus; need for additional procedures including hysterectomy in the event of a life-threatening hemorrhage; formation of adhesions; placental abnormalities wth subsequent pregnancies; incisional problems; thromboembolic phenomenon and other postoperative/anesthesia complications. The patient concurred with the proposed plan, giving informed written consent for the procedure. Patient has been NPO since last night she will remain NPO for procedure. Anesthesia and OR aware. Preoperative prophylactic antibiotics and SCDs ordered on call to the OR. To OR when ready.   BMI:  -40.85 -Normal growth US  and reassuring surveillance antenatally -Plan for Lovenox as postpartum VTE ppx  AMA: -NIPT low risk, anatomy US  normal   [name], DO [facility]

## 2024-03-31 LAB — CBC
HCT: 28.6 % — ABNORMAL LOW (ref 36.0–46.0)
Hemoglobin: 9.6 g/dL — ABNORMAL LOW (ref 12.0–15.0)
MCH: 29.9 pg (ref 26.0–34.0)
MCHC: 33.6 g/dL (ref 30.0–36.0)
MCV: 89.1 fL (ref 80.0–100.0)
Platelets: 176 10*3/uL (ref 150–400)
RBC: 3.21 MIL/uL — ABNORMAL LOW (ref 3.87–5.11)
RDW: 12.6 % (ref 11.5–15.5)
WBC: 12.1 10*3/uL — ABNORMAL HIGH (ref 4.0–10.5)
nRBC: 0 % (ref 0.0–0.2)

## 2024-03-31 MED ORDER — FERROUS SULFATE 325 (65 FE) MG PO TABS
325.0000 mg | ORAL_TABLET | Freq: Every day | ORAL | Status: DC
  Administered 2024-03-31 – 2024-04-01 (×2): 325 mg via ORAL
  Filled 2024-03-31 (×2): qty 1

## 2024-03-31 MED ORDER — PANTOPRAZOLE SODIUM 40 MG PO TBEC
40.0000 mg | DELAYED_RELEASE_TABLET | Freq: Every day | ORAL | Status: DC
  Administered 2024-03-31 – 2024-04-01 (×2): 40 mg via ORAL
  Filled 2024-03-31 (×2): qty 1

## 2024-03-31 NOTE — Anesthesia Postprocedure Evaluation (Signed)
 Anesthesia Post Note  Patient: Gabriella Pennington  Procedure(s) Performed: CESAREAN DELIVERY  Patient location during evaluation: Mother Baby Anesthesia Type: Spinal Level of consciousness: oriented and awake and alert Pain management: pain level controlled Vital Signs Assessment: post-procedure vital signs reviewed and stable Respiratory status: spontaneous breathing and respiratory function stable Cardiovascular status: blood pressure returned to baseline and stable Postop Assessment: no headache, no apparent nausea or vomiting and able to ambulate Anesthetic complications: no   No notable events documented.   Last Vitals:  Vitals:   03/31/24 0302 03/31/24 0837  BP: 102/71 101/69  Pulse: 64   Resp: 18 20  Temp: 36.4 C 36.7 C  SpO2: 97%     Last Pain:  Vitals:   03/31/24 0839  TempSrc:   PainSc: 2                  Vanice Genre

## 2024-03-31 NOTE — Anesthesia Post-op Follow-up Note (Signed)
  Anesthesia Pain Follow-up Note  Patient: Gabriella Pennington  Day #: 1  Date of Follow-up: 03/31/2024 Time: 11:39 AM  Last Vitals:  Vitals:   03/31/24 0302 03/31/24 0837  BP: 102/71 101/69  Pulse: 64   Resp: 18 20  Temp: 36.4 C 36.7 C  SpO2: 97%     Level of Consciousness: alert  Pain: mild   Side Effects:Pruritis  Catheter Site Exam:clean, dry  Anti-Coag Meds (From admission, onward)    Start     Dose/Rate Route Frequency Ordered Stop   03/31/24 0800  enoxaparin  (LOVENOX ) injection 40 mg        40 mg Subcutaneous Every 24 hours 03/30/24 1245          Plan: D/C from anesthesia care at surgeon's request  Vanice Genre

## 2024-03-31 NOTE — Progress Notes (Signed)
 Subjective: Postpartum Day 1: Cesarean Delivery Gabriella Pennington is feeling well overall. She is ambulating, voiding, and tolerating POs without difficulty. Her pain is well-controlled and her bleeding is WNL. Her mood is stable. She is having heart burn and usually takes a daily PPI. Baby is in SCN for an O2 requirement after birth, and Gabriella Pennington has been up visiting.     Objective: Vital signs in last 24 hours: Temp:  [97.6 F (36.4 C)-98.5 F (36.9 C)] 98 F (36.7 C) (05/31 0837) Pulse Rate:  [54-110] 64 (05/31 0302) Resp:  [11-25] 20 (05/31 0837) BP: (84-110)/(38-71) 101/69 (05/31 0837) SpO2:  [86 %-100 %] 97 % (05/31 0302)  Physical Exam:  General: alert and cooperative Heart: RRR Lungs: CTAB Lochia: appropriate Uterine Fundus: firm Incision: dressing C/D/I, On-Q pump in place DVT Evaluation: No evidence of DVT seen on physical exam.  Recent Labs    03/28/24 1339 03/31/24 0610  HGB 12.9 9.6*  HCT 38.1 28.6*    Assessment/Plan: Status post Cesarean section. Doing well postoperatively.  Dressing change after shower Protonix  ordered  PO iron Routine postpartum care Anticipate discharge in 1-2 days .  Gabriella Pennington, CNM 03/31/2024, 9:25 AM

## 2024-04-01 MED ORDER — OXYCODONE HCL 5 MG PO TABS: 5.0000 mg | ORAL_TABLET | ORAL | 0 refills | Status: AC | PRN

## 2024-04-01 NOTE — Final Progress Note (Signed)
 Physician Final Progress Note  Patient ID: Gabriella Pennington MRN: [id number] DOB/AGE: 05-20-1986 38 y.o.  Admit date: 03/30/2024 Admitting provider: [name], MD Discharge date: 04/01/2024   Admission Diagnoses: Repeat cesarean section  Discharge Diagnoses:  Principal Problem:   Cesarean delivery delivered Active Problems:   Previous cesarean delivery affecting pregnancy   Single live birth    Consults: None  Significant Findings/ Diagnostic Studies: none  Procedures: repeat cesarean section  Discharge Condition: good  Disposition:  Pt having right sided abdominal pain. She states more tender in that area. No redness or mass palpated. No signs of infection.  Pt did do a lot of walking yesterday. She state she may have over done it.  Discussed manipulation of tissue during surgery, scar tissue from previous cesarean sections as likely cause of tenderness. Red flag symptoms reviewed. She is requesting to go home today.   Diet: Regular diet  Discharge Activity: Activity as tolerated and no driving for today and No lifting, driving, or strenuous exercise for 3 weeks   Allergies as of 04/01/2024       Reactions   Penicillins Hives        Medication List     STOP taking these medications    aspirin  EC 81 MG tablet   ondansetron  4 MG disintegrating tablet Commonly known as: ZOFRAN -ODT       TAKE these medications    docusate 50 MG/5ML liquid Commonly known as: COLACE Take 10 mLs (100 mg total) by mouth 2 (two) times daily.   multivitamin-prenatal 27-0.8 MG Tabs tablet Take 1 tablet by mouth daily at 12 noon.   omeprazole  20 MG tablet Commonly known as: PriLOSEC  OTC Take 1 tablet (20 mg total) by mouth daily.   oxyCODONE  5 MG immediate release tablet Commonly known as: Oxy IR/ROXICODONE  Take 1-2 tablets (5-10 mg total) by mouth every 4 (four) hours as needed for moderate pain (pain score 4-6).         Total time spent taking care of this patient: 15  minutes  Signed: Alise Appl 04/01/2024, 8:32 AM

## 2024-04-01 NOTE — Discharge Summary (Signed)
 Postpartum Discharge Summary  Date of Service updated 04/01/2024     Patient Name: Gabriella Pennington DOB: [birth year] MRN: [id number]  Date of admission: 03/30/2024 Delivery date:03/30/2024 Delivering provider: [name] Date of discharge: 04/01/2024  Admitting diagnosis: History of cesarean delivery, antepartum [O34.219] Indication for care in labor or delivery [O75.9] Intrauterine pregnancy: [redacted]w[redacted]d     Secondary diagnosis:  Principal Problem:   Cesarean delivery delivered Active Problems:   Previous cesarean delivery affecting pregnancy   Single live birth  Additional problems: none    Discharge diagnosis: Term Pregnancy Delivered                                              Post partum procedures:none Augmentation: N/A Complications: None  Hospital course: Sceduled C/S   38 y.o. yo [id number] at [redacted]w[redacted]d was admitted to the hospital 03/30/2024 for scheduled cesarean section with the following indication:Elective Repeat.Delivery details are as follows:  Membrane Rupture Time/Date: 8:16 AM,03/30/2024  Delivery Method:C-Section, Low Transverse Operative Delivery:N/A Details of operation can be found in separate operative note.  Patient had a postpartum course uncomplicated .  She is ambulating, tolerating a regular diet, passing flatus, and urinating well. Patient is discharged home in stable condition on  04/01/24        Newborn Data: Birth date:03/30/2024 Birth time:8:17 AM Gender:Female Living status:Living Apgars:8 ,8  Weight:3600 g    Magnesium  Sulfate received: No BMZ received: No Rhophylac:N/A MMR:N/A T-DaP:Given prenatally Flu: N/A RSV Vaccine received: No Transfusion:No Immunizations administered: Immunization History  Administered Date(s) Administered   Hepatitis B, PED/ADOLESCENT 08/18/1998, 09/15/1998, 03/03/1999   MMR 11/19/1988   Tdap 05/02/2006, 01/17/2024    Physical exam  Vitals:   03/31/24 0837 03/31/24 1643 03/31/24 2317 04/01/24 0759  BP: 101/69  107/61 96/64 122/77  Pulse:  72 67 81  Resp: 20 18 18 18   Temp: 98 F (36.7 C) 98.9 F (37.2 C) 97.8 F (36.6 C) 98.4 F (36.9 C)  TempSrc: Oral Oral  Oral  SpO2:  100% 96% 98%  Weight:      Height:       General: alert, cooperative, and no distress Lochia: appropriate Uterine Fundus: firm Incision: Dressing is clean, dry, and intact DVT Evaluation: No evidence of DVT seen on physical exam. Labs: Lab Results  Component Value Date   WBC 12.1 (H) 03/31/2024   HGB 9.6 (L) 03/31/2024   HCT 28.6 (L) 03/31/2024   MCV 89.1 03/31/2024   PLT 176 03/31/2024      Latest Ref Rng & Units 03/30/2024    1:41 PM  CMP  Creatinine 0.44 - 1.00 mg/dL 7.82    Edinburgh Score:    03/31/2024   11:18 PM  Edinburgh Postnatal Depression Scale Screening Tool  I have been able to laugh and see the funny side of things. 0  I have looked forward with enjoyment to things. 0  I have blamed myself unnecessarily when things went wrong. 1  I have been anxious or worried for no good reason. 0  I have felt scared or panicky for no good reason. 0  Things have been getting on top of me. 1  I have been so unhappy that I have had difficulty sleeping. 0  I have felt sad or miserable. 0  I have been so unhappy that I have been crying. 0  The thought  of harming myself has occurred to me. 0  Edinburgh Postnatal Depression Scale Total 2      After visit meds:  Allergies as of 04/01/2024       Reactions   Penicillins Hives        Medication List     STOP taking these medications    aspirin  EC 81 MG tablet   ondansetron  4 MG disintegrating tablet Commonly known as: ZOFRAN -ODT       TAKE these medications    docusate 50 MG/5ML liquid Commonly known as: COLACE Take 10 mLs (100 mg total) by mouth 2 (two) times daily.   multivitamin-prenatal 27-0.8 MG Tabs tablet Take 1 tablet by mouth daily at 12 noon.   omeprazole  20 MG tablet Commonly known as: PriLOSEC  OTC Take 1 tablet (20 mg  total) by mouth daily.   oxyCODONE  5 MG immediate release tablet Commonly known as: Oxy IR/ROXICODONE  Take 1-2 tablets (5-10 mg total) by mouth every 4 (four) hours as needed for moderate pain (pain score 4-6).         Discharge home in stable condition Infant Feeding: Breast Infant Disposition:home with mother Discharge instruction: per After Visit Summary and Postpartum booklet. Activity: Advance as tolerated. Pelvic rest for 6 weeks.  Diet: routine diet Anticipated Birth Control: Nexplanon, pt request to have it placed in office Postpartum Appointment:1 week Additional Postpartum F/U: Incision check 1 week Future Appointments: Future Appointments  Date Time Provider Department Center  04/10/2024  2:35 PM [name], MD [facility] None   Follow up Visit: 1 week in office for incision check  2 week video visit for mood screening 6 week ppv      04/01/2024 Alise Appl, CNM

## 2024-04-02 ENCOUNTER — Encounter: Payer: Self-pay | Admitting: Obstetrics

## 2024-04-02 ENCOUNTER — Telehealth: Payer: Self-pay

## 2024-04-02 NOTE — Telephone Encounter (Signed)
 Spoke with patient. Advised On Q pain pump usually only lasts for a few days post procedure. Advised she can remove the catheter. Sent instructions on how to do this. She does have rx for oxycodone . She will call back if she has any additional questions or concerns.

## 2024-04-02 NOTE — Telephone Encounter (Signed)
 Gabriella Pennington

## 2024-04-06 ENCOUNTER — Encounter: Admitting: Obstetrics

## 2024-04-09 NOTE — Progress Notes (Unsigned)
   Postoperative Cesarean Follow-up Visit Gabriella Pennington is a 38 y.o. [id number] s/p rLTCS at [redacted]w[redacted]d for History of previous cesarean section x 4, POD#11, here today for incision check.  Subjective: The patient is not having any pain. She denies fever, chills, nausea, and vomiting. Eating a regular diet without difficulty.  Is not having regular bowel movements. Activity: normal activities of daily living. Bleeding is brown. She denies issues with her incision.    Objective: BP 123/65   Pulse 67   Ht 5\' 3"  (1.6 m)   Wt 212 lb (96.2 kg)   Breastfeeding No   BMI 37.55 kg/m  Body mass index is 37.55 kg/m.  General:  alert and no distress  Abdomen: soft, bowel sounds active, non-tender  Incision:   healing well, no drainage, no erythema, no hernia, no seroma, no swelling, no dehiscence, incision well approximated    Assessment/Plan: Gabriella Pennington is a 38 y.o. [id number] s/p rLTCS at [redacted]w[redacted]d for History of previous cesarean section x 4, POD#11, here today for incision check, healing well. No concerns with incision today, remaining steri-strips removed and replaced.  -Discussed at-home care, healing expectations, si/sx of infection.  -Call clinic if developing redness, discharge, or increasing pain. -Avoid vigorous scrubbing/washing of incision site; hygiene reviewed. -May trim back steri-strips if they peel; should fully remove after 1 week from today. -May resume driving and light walking. Still no heavy lifting >10-12 lbs and pelvic rest advised until cleared at 6wk postpartum visit.  -If stooling regularly x 1-2 weeks, can take stool softener every other day x 1 week, taper as tolerated.  Return in about 5 weeks (around 05/15/2024) for 6wpp with nexplanon .   Gabriella Dunn, DO [facility]

## 2024-04-10 ENCOUNTER — Ambulatory Visit (INDEPENDENT_AMBULATORY_CARE_PROVIDER_SITE_OTHER): Admitting: Obstetrics

## 2024-04-10 ENCOUNTER — Encounter: Payer: Self-pay | Admitting: Obstetrics

## 2024-05-14 NOTE — Patient Instructions (Incomplete)
 Nexplanon  Instructions After Insertion  Keep bandage clean and dry for 24 hours  May use ice/Tylenol /Ibuprofen  for soreness or pain  If you develop fever, drainage or increased warmth from incision site-contact office immediately

## 2024-05-14 NOTE — Progress Notes (Unsigned)
 OBSTETRICS POSTPARTUM CLINIC PROGRESS NOTE  Subjective:     Gabriella Pennington is a 38 y.o. [id number] female who presents for a postpartum visit. She is 6 weeks postpartum following a low transverse Cesarean section. I have reviewed the prenatal and intrapartum course. The delivery was at 39 gestational weeks.  Anesthesia: spinal. Postpartum course has been normal. Baby's course has been normal. Baby is feeding by bottle - Similac Advance. Bleeding: patient has not not resumed menses.   Bowel function is abnormal: patient reports constipation - only having bowel movements once every 1-2 weeks.  Has not tried any medication to help with this.Bladder function is normal. Patient is not sexually active. Contraception method desired is Nexplanon . Postpartum depression screening: negative.  EDPS score is 1.   Wants to discuss constipation, is only going 1 x week. Is feeling abd cramping and pain, has sour stomach and isn't taking anything OTC to help.   Has some pink ovals on her face that were there at the end of pregnancy, haven't improved. Are not itchy, but will get dry and flaky. Feels like they are worse. Hasn't been putting anything on them.   The following portions of the patient's history were reviewed and updated as appropriate: allergies, current medications, past family history, past medical history, past social history, past surgical history, and problem list.  Review of Systems Pertinent items are noted in HPI.   Objective:    BP (!) 109/57   Pulse 61   Ht 5' 3 (1.6 m)   Wt 215 lb 6.4 oz (97.7 kg)   BMI 38.16 kg/m   General:  alert and no distress   Breasts:  inspection negative, no nipple discharge or bleeding, no masses or nodularity palpable  Lungs: clear to auscultation bilaterally  Heart:  regular rate and rhythm, S1, S2 normal, no murmur, click, rub or gallop  Abdomen: soft, non-tender; bowel sounds normal; no masses,  no organomegaly.  Well healed Pfannenstiel incision    Vulva:  normal  Vagina: normal vagina, no discharge, exudate, lesion, or erythema  Cervix:  no cervical motion tenderness and no lesions  Corpus: normal size, contour, position, consistency, mobility, non-tender  Adnexa:  normal adnexa and no mass, fullness, tenderness  Rectal Exam: Not performed.         Labs:  Lab Results  Component Value Date   HGB 9.6 (L) 03/31/2024   Nexplanon  Insertion Procedure Patient identified, informed consent performed, consent signed.   Patient does understand that irregular bleeding is a very common side effect of this medication. She was advised to have backup contraception for one week after placement. Pregnancy test in clinic today was negative.  Appropriate time out taken.  Patient's left arm was prepped and draped in the usual sterile fashion. The ruler used to measure and mark insertion area.  Patient was prepped with alcohol swab and then injected with 3 ml of 1% lidocaine .  She was prepped with betadine , Nexplanon  removed from packaging,  Device confirmed in needle, then inserted full length of needle and withdrawn per handbook instructions. Nexplanon  was able to palpated in the patient's arm; patient palpated the insert herself. There was minimal blood loss.  Patient insertion site covered with guaze and a pressure bandage to reduce any bruising.  The patient tolerated the procedure well and was given post procedure instructions.   Assessment:   1. Encounter for initial prescription of implantable subdermal contraceptive   2. Cervical cancer screening   3. Anemia, unspecified type  4. Postpartum care following cesarean delivery   5. Pre-procedural laboratory examination     Plan:  Gabriella Pennington is a 38 y.o. [id number] s/p rLTCS without complications, now at the 6 week mark, today is doing well. EPDS=1.  -CBC for Hgb < 10 -Contraception: Nexplanon , inserted today -Okay to resume all regular activity as tolerated-link to postpartum exercises in AVS  today. Do exercises 3-5 times weekly, daily if able.  -Reviewed returning to intercourse expectations. -Hair loss and pelvic floor function expectations discussed. -Continue PNV as daily multivitamin. -Moods reviewed, alert clinic if developing PPD/A -RTC for WWE in 12 months, sooner if concerns.  Nexplanon : -Pt aware needs to use backup method for 7 days after insertion -Aware does not provide STD protection, is contraception only -Aftercare handout discussed and provided to patient re: home care, irregular bleeding expectations, return of fertility, etc. -RTC for removal in 3 years, sooner if desires.  Constipation:  -Miralax  cleanout discussed, increase fiber, water intake, and daily physical activity -If persists, discuss with PCP  Facial lesions:  -Dermatology referral   Estil Mangle, DO [facility] 

## 2024-05-16 ENCOUNTER — Other Ambulatory Visit (HOSPITAL_COMMUNITY)
Admission: RE | Admit: 2024-05-16 | Discharge: 2024-05-16 | Disposition: A | Discharge status: Home / Self Care | Source: Ambulatory Visit | Attending: Obstetrics | Admitting: Obstetrics

## 2024-05-16 ENCOUNTER — Ambulatory Visit (INDEPENDENT_AMBULATORY_CARE_PROVIDER_SITE_OTHER): Admitting: Obstetrics

## 2024-05-16 DIAGNOSIS — Z30017 Encounter for initial prescription of implantable subdermal contraceptive: Secondary | ICD-10-CM | POA: Insufficient documentation

## 2024-05-16 DIAGNOSIS — Z01812 Encounter for preprocedural laboratory examination: Secondary | ICD-10-CM

## 2024-05-16 DIAGNOSIS — Z3046 Encounter for surveillance of implantable subdermal contraceptive: Secondary | ICD-10-CM | POA: Diagnosis not present

## 2024-05-16 DIAGNOSIS — Z124 Encounter for screening for malignant neoplasm of cervix: Secondary | ICD-10-CM

## 2024-05-16 DIAGNOSIS — K5909 Other constipation: Secondary | ICD-10-CM

## 2024-05-16 DIAGNOSIS — L989 Disorder of the skin and subcutaneous tissue, unspecified: Secondary | ICD-10-CM

## 2024-05-16 DIAGNOSIS — O9081 Anemia of the puerperium: Secondary | ICD-10-CM

## 2024-05-16 MED ORDER — ETONOGESTREL 68 MG ~~LOC~~ IMPL
68.0000 mg | DRUG_IMPLANT | Freq: Once | SUBCUTANEOUS | Status: AC
  Administered 2024-05-16: 68 mg via SUBCUTANEOUS

## 2024-05-17 LAB — CBC
Hematocrit: 41 % (ref 34.0–46.6)
Hemoglobin: 13.2 g/dL (ref 11.1–15.9)
MCH: 29.4 pg (ref 26.6–33.0)
MCHC: 32.2 g/dL (ref 31.5–35.7)
MCV: 91 fL (ref 79–97)
Platelets: 291 x10E3/uL (ref 150–450)
RBC: 4.49 x10E6/uL (ref 3.77–5.28)
RDW: 12.5 % (ref 11.7–15.4)
WBC: 7.2 x10E3/uL (ref 3.4–10.8)

## 2024-05-21 ENCOUNTER — Ambulatory Visit: Payer: Self-pay | Admitting: Obstetrics

## 2024-05-22 LAB — CYTOLOGY - PAP
Adequacy: ABSENT
Chlamydia: NEGATIVE
Comment: NEGATIVE
Comment: NORMAL
Diagnosis: NEGATIVE
Neisseria Gonorrhea: NEGATIVE
